# Patient Record
Sex: Female | Born: 1975 | Race: White | Hispanic: Yes | Smoking: Never smoker
Health system: Southern US, Community
[De-identification: ages and names within clinical notes are randomized; demographics above are authoritative.]

## PROBLEM LIST (undated history)

## (undated) DIAGNOSIS — K219 Gastro-esophageal reflux disease without esophagitis: Secondary | ICD-10-CM

## (undated) DIAGNOSIS — N814 Uterovaginal prolapse, unspecified: Secondary | ICD-10-CM

## (undated) DIAGNOSIS — Z973 Presence of spectacles and contact lenses: Secondary | ICD-10-CM

## (undated) DIAGNOSIS — R7611 Nonspecific reaction to tuberculin skin test without active tuberculosis: Secondary | ICD-10-CM

## (undated) DIAGNOSIS — A159 Respiratory tuberculosis unspecified: Secondary | ICD-10-CM

## (undated) HISTORY — DX: Respiratory tuberculosis unspecified: A15.9

## (undated) HISTORY — DX: Uterovaginal prolapse, unspecified: N81.4

---

## 1998-10-04 ENCOUNTER — Emergency Department (HOSPITAL_COMMUNITY): Admission: EM | Admit: 1998-10-04 | Discharge: 1998-10-04 | Payer: Self-pay | Admitting: Emergency Medicine

## 1998-12-04 ENCOUNTER — Other Ambulatory Visit: Admission: RE | Admit: 1998-12-04 | Discharge: 1998-12-04 | Payer: Self-pay | Admitting: Obstetrics

## 1999-01-25 ENCOUNTER — Inpatient Hospital Stay (HOSPITAL_COMMUNITY): Admission: AD | Admit: 1999-01-25 | Discharge: 1999-01-25 | Payer: Self-pay | Admitting: Obstetrics & Gynecology

## 1999-01-27 ENCOUNTER — Inpatient Hospital Stay (HOSPITAL_COMMUNITY): Admission: AD | Admit: 1999-01-27 | Discharge: 1999-01-27 | Payer: Self-pay | Admitting: *Deleted

## 1999-01-29 ENCOUNTER — Ambulatory Visit (HOSPITAL_COMMUNITY): Admission: AD | Admit: 1999-01-29 | Discharge: 1999-01-29 | Payer: Self-pay | Admitting: *Deleted

## 1999-01-29 ENCOUNTER — Encounter (INDEPENDENT_AMBULATORY_CARE_PROVIDER_SITE_OTHER): Payer: Self-pay | Admitting: Specialist

## 1999-11-08 ENCOUNTER — Emergency Department (HOSPITAL_COMMUNITY): Admission: EM | Admit: 1999-11-08 | Discharge: 1999-11-08 | Payer: Self-pay | Admitting: Emergency Medicine

## 1999-11-09 ENCOUNTER — Encounter: Payer: Self-pay | Admitting: Emergency Medicine

## 1999-11-10 ENCOUNTER — Emergency Department (HOSPITAL_COMMUNITY): Admission: EM | Admit: 1999-11-10 | Discharge: 1999-11-10 | Payer: Self-pay | Admitting: Emergency Medicine

## 2000-04-18 ENCOUNTER — Ambulatory Visit (HOSPITAL_COMMUNITY): Admission: RE | Admit: 2000-04-18 | Discharge: 2000-04-18 | Payer: Self-pay | Admitting: Obstetrics & Gynecology

## 2000-06-13 ENCOUNTER — Ambulatory Visit (HOSPITAL_COMMUNITY): Admission: RE | Admit: 2000-06-13 | Discharge: 2000-06-13 | Payer: Self-pay | Admitting: *Deleted

## 2000-10-12 ENCOUNTER — Inpatient Hospital Stay (HOSPITAL_COMMUNITY): Admission: AD | Admit: 2000-10-12 | Discharge: 2000-10-14 | Payer: Self-pay | Admitting: *Deleted

## 2000-10-12 ENCOUNTER — Encounter (INDEPENDENT_AMBULATORY_CARE_PROVIDER_SITE_OTHER): Payer: Self-pay

## 2003-09-02 ENCOUNTER — Emergency Department (HOSPITAL_COMMUNITY): Admission: EM | Admit: 2003-09-02 | Discharge: 2003-09-02 | Payer: Self-pay | Admitting: Family Medicine

## 2004-01-05 ENCOUNTER — Emergency Department (HOSPITAL_COMMUNITY): Admission: EM | Admit: 2004-01-05 | Discharge: 2004-01-05 | Payer: Self-pay | Admitting: Emergency Medicine

## 2009-03-24 ENCOUNTER — Inpatient Hospital Stay (HOSPITAL_COMMUNITY): Admission: AD | Admit: 2009-03-24 | Discharge: 2009-03-24 | Payer: Self-pay | Admitting: Obstetrics & Gynecology

## 2009-05-23 ENCOUNTER — Inpatient Hospital Stay (HOSPITAL_COMMUNITY): Admission: AD | Admit: 2009-05-23 | Discharge: 2009-05-23 | Payer: Self-pay | Admitting: Family Medicine

## 2009-06-29 ENCOUNTER — Ambulatory Visit (HOSPITAL_COMMUNITY): Admission: RE | Admit: 2009-06-29 | Discharge: 2009-06-29 | Payer: Self-pay | Admitting: Obstetrics & Gynecology

## 2009-10-04 ENCOUNTER — Ambulatory Visit: Payer: Self-pay | Admitting: Obstetrics and Gynecology

## 2009-10-04 ENCOUNTER — Inpatient Hospital Stay (HOSPITAL_COMMUNITY): Admission: AD | Admit: 2009-10-04 | Discharge: 2009-10-05 | Payer: Self-pay | Admitting: Family Medicine

## 2010-06-20 LAB — CBC
HCT: 32.8 % — ABNORMAL LOW (ref 36.0–46.0)
HCT: 36.3 % (ref 36.0–46.0)
Hemoglobin: 11.4 g/dL — ABNORMAL LOW (ref 12.0–15.0)
Hemoglobin: 12.4 g/dL (ref 12.0–15.0)
MCH: 32.3 pg (ref 26.0–34.0)
MCHC: 34.3 g/dL (ref 30.0–36.0)
MCV: 94.2 fL (ref 78.0–100.0)
Platelets: 271 10*3/uL (ref 150–400)
RBC: 3.47 MIL/uL — ABNORMAL LOW (ref 3.87–5.11)
RBC: 3.85 MIL/uL — ABNORMAL LOW (ref 3.87–5.11)
RDW: 13.9 % (ref 11.5–15.5)
WBC: 10.1 10*3/uL (ref 4.0–10.5)
WBC: 8.5 10*3/uL (ref 4.0–10.5)

## 2010-06-20 LAB — RPR: RPR Ser Ql: NONREACTIVE

## 2010-07-05 LAB — WET PREP, GENITAL
Trich, Wet Prep: NONE SEEN
Yeast Wet Prep HPF POC: NONE SEEN

## 2010-07-05 LAB — GC/CHLAMYDIA PROBE AMP, GENITAL
Chlamydia, DNA Probe: NEGATIVE
GC Probe Amp, Genital: NEGATIVE

## 2013-10-19 ENCOUNTER — Encounter (HOSPITAL_COMMUNITY): Payer: Self-pay | Admitting: Emergency Medicine

## 2013-10-19 ENCOUNTER — Emergency Department (HOSPITAL_COMMUNITY)
Admission: EM | Admit: 2013-10-19 | Discharge: 2013-10-20 | Disposition: A | Discharge status: Home / Self Care | Payer: Medicaid Other | Attending: Emergency Medicine | Admitting: Emergency Medicine

## 2013-10-19 ENCOUNTER — Emergency Department (HOSPITAL_COMMUNITY): Payer: Medicaid Other

## 2013-10-19 DIAGNOSIS — R509 Fever, unspecified: Secondary | ICD-10-CM | POA: Diagnosis not present

## 2013-10-19 DIAGNOSIS — R0602 Shortness of breath: Secondary | ICD-10-CM | POA: Diagnosis not present

## 2013-10-19 DIAGNOSIS — Z3202 Encounter for pregnancy test, result negative: Secondary | ICD-10-CM | POA: Diagnosis not present

## 2013-10-19 DIAGNOSIS — R1011 Right upper quadrant pain: Secondary | ICD-10-CM | POA: Diagnosis present

## 2013-10-19 DIAGNOSIS — R110 Nausea: Secondary | ICD-10-CM | POA: Insufficient documentation

## 2013-10-19 DIAGNOSIS — K8020 Calculus of gallbladder without cholecystitis without obstruction: Secondary | ICD-10-CM | POA: Diagnosis not present

## 2013-10-19 DIAGNOSIS — Z79899 Other long term (current) drug therapy: Secondary | ICD-10-CM | POA: Insufficient documentation

## 2013-10-19 LAB — URINE MICROSCOPIC-ADD ON

## 2013-10-19 LAB — URINALYSIS, ROUTINE W REFLEX MICROSCOPIC
Bilirubin Urine: NEGATIVE
GLUCOSE, UA: NEGATIVE mg/dL
KETONES UR: NEGATIVE mg/dL
Nitrite: NEGATIVE
PROTEIN: NEGATIVE mg/dL
Specific Gravity, Urine: 1.014 (ref 1.005–1.030)
Urobilinogen, UA: 0.2 mg/dL (ref 0.0–1.0)
pH: 6.5 (ref 5.0–8.0)

## 2013-10-19 LAB — CBC WITH DIFFERENTIAL/PLATELET
BASOS PCT: 1 % (ref 0–1)
Basophils Absolute: 0 10*3/uL (ref 0.0–0.1)
EOS ABS: 0.5 10*3/uL (ref 0.0–0.7)
Eosinophils Relative: 6 % — ABNORMAL HIGH (ref 0–5)
HCT: 36 % (ref 36.0–46.0)
HEMOGLOBIN: 11.7 g/dL — AB (ref 12.0–15.0)
Lymphocytes Relative: 27 % (ref 12–46)
Lymphs Abs: 2.1 10*3/uL (ref 0.7–4.0)
MCH: 29.3 pg (ref 26.0–34.0)
MCHC: 32.5 g/dL (ref 30.0–36.0)
MCV: 90 fL (ref 78.0–100.0)
MONOS PCT: 9 % (ref 3–12)
Monocytes Absolute: 0.7 10*3/uL (ref 0.1–1.0)
NEUTROS PCT: 57 % (ref 43–77)
Neutro Abs: 4.5 10*3/uL (ref 1.7–7.7)
Platelets: 300 10*3/uL (ref 150–400)
RBC: 4 MIL/uL (ref 3.87–5.11)
RDW: 12.6 % (ref 11.5–15.5)
WBC: 7.7 10*3/uL (ref 4.0–10.5)

## 2013-10-19 LAB — COMPREHENSIVE METABOLIC PANEL
ALK PHOS: 83 U/L (ref 39–117)
ALT: 39 U/L — AB (ref 0–35)
AST: 30 U/L (ref 0–37)
Albumin: 3.6 g/dL (ref 3.5–5.2)
Anion gap: 16 — ABNORMAL HIGH (ref 5–15)
BILIRUBIN TOTAL: 0.3 mg/dL (ref 0.3–1.2)
BUN: 7 mg/dL (ref 6–23)
CHLORIDE: 100 meq/L (ref 96–112)
CO2: 22 meq/L (ref 19–32)
Calcium: 8.4 mg/dL (ref 8.4–10.5)
Creatinine, Ser: 0.52 mg/dL (ref 0.50–1.10)
GLUCOSE: 84 mg/dL (ref 70–99)
POTASSIUM: 3.5 meq/L — AB (ref 3.7–5.3)
SODIUM: 138 meq/L (ref 137–147)
TOTAL PROTEIN: 7.9 g/dL (ref 6.0–8.3)

## 2013-10-19 LAB — POC URINE PREG, ED: Preg Test, Ur: NEGATIVE

## 2013-10-19 LAB — LIPASE, BLOOD: Lipase: 37 U/L (ref 11–59)

## 2013-10-19 MED ORDER — HYDROCODONE-ACETAMINOPHEN 5-325 MG PO TABS: 1.0000 | ORAL_TABLET | ORAL | Status: DC | PRN

## 2013-10-19 MED ORDER — MORPHINE SULFATE 4 MG/ML IJ SOLN
4.0000 mg | Freq: Once | INTRAMUSCULAR | Status: AC
  Administered 2013-10-19: 4 mg via INTRAVENOUS
  Filled 2013-10-19: qty 1

## 2013-10-19 MED ORDER — ONDANSETRON HCL 4 MG/2ML IJ SOLN
4.0000 mg | Freq: Once | INTRAMUSCULAR | Status: AC
  Administered 2013-10-19: 4 mg via INTRAVENOUS
  Filled 2013-10-19: qty 2

## 2013-10-19 NOTE — ED Provider Notes (Signed)
Charne Mcbrien S 8:00 PM the patient discussed and signed. Patient with several days of right upper quadrant pains. No significant findings on laboratory testing. Abdominal ultrasound pending to evaluate a biliary cause.  9:00 PM ultrasound demonstrates impacted cholelithiasis within the gallbladder neck. There is gallbladder thickening, distention and slight pericholecystic fluid.  9:15 PM patient reevaluated. She has very minimal abdominal pain on exam. No peritoneal signs. She has no significant of complaints of pain at this time. She was given morphine at 7:15.  9:20 PM discussed the patient's case and ultrasound findings with Dr. Rosendo Gros with general surgery. He recommends giving the patient a by mouth challenge and she does not have any significant pains, nausea or vomiting symptoms she may be discharged to followup with the clinic on Monday.  11:15PM patient reevaluated after drinking fluids and having a snack. She has had part of this [place], applesauce and water. She reported having very mild return of pain for a brief time which resolved on its own after eating. She is currently feeling well does wish to return home and will plan to call the surgeon office on Monday. Strict return precautions were given and emphasized. Patient understands that she should return if she has severe pains.  Martie Lee, PA-C 10/19/13 2328

## 2013-10-19 NOTE — ED Notes (Signed)
To ultrasound

## 2013-10-19 NOTE — ED Provider Notes (Signed)
CSN: [id number]     Arrival date & time 10/19/13  1651 History   First MD Initiated Contact with Patient 10/19/13 1830     Chief Complaint  Patient presents with  . Abdominal Pain     (Consider location/radiation/quality/duration/timing/severity/associated sxs/prior Treatment) HPI Pt is a 38yo female presenting to ED with c/o RUQ pain that started 6 days ago.  Pt is accompanied by family who helped with translation. Pt states pain was in her epigastric region on Monday, 7/13, associated with nausea and vomiting x2.  Pain was 5/10 at worst but improved with omeprazole.  Pain has resolved from epigastrium but now located in her RUQ.  Pain is occasionally severe enough to take her breath away.  Denies chest pain or SOB at this time. Pain is 3/10 at this time. Pain does not radiate. Pain comes and goes w/o known aggrevating factors, omeprazole does occasionally help with pain.  Reports nausea today w/o vomiting. Pt has had hot and cold chills but no recorded fevers. Denies urinary or vaginal symptoms. Denies hx of abdominal surgeries. No hx of similar pain.    History reviewed. No pertinent past medical history. History reviewed. No pertinent past surgical history. History reviewed. No pertinent family history. History  Substance Use Topics  . Smoking status: Not on file  . Smokeless tobacco: Not on file  . Alcohol Use: No   OB History   Grav Para Term Preterm Abortions TAB SAB Ect Mult Living                 Review of Systems  Constitutional: Positive for fever ( subjective) and chills. Negative for diaphoresis, appetite change and fatigue.  Respiratory: Positive for shortness of breath ( occasionally when pain is severe). Negative for cough.   Cardiovascular: Negative for chest pain, palpitations and leg swelling.  Gastrointestinal: Positive for nausea and abdominal pain (RUQ). Negative for vomiting, diarrhea and constipation.  Genitourinary: Negative for dysuria, urgency, frequency,  hematuria, flank pain, vaginal discharge, vaginal pain and pelvic pain.  Musculoskeletal: Negative for back pain and myalgias.  All other systems reviewed and are negative.     Allergies  Review of patient's allergies indicates no known allergies.  Home Medications   Prior to Admission medications   Medication Sig Start Date End Date Taking? Authorizing Provider  alum & mag hydroxide-simeth (MAALOX/MYLANTA) 200-200-20 MG/5ML suspension Take 15 mLs by mouth every 6 (six) hours as needed for indigestion or heartburn.   Yes Historical Provider, MD  omeprazole (PRILOSEC) 20 MG capsule Take 20 mg by mouth daily.   Yes Historical Provider, MD   BP 130/79  Pulse 89  Temp(Src) 97.6 F (36.4 C) (Oral)  Resp 16  SpO2 100%  LMP 09/22/2013 Physical Exam  Nursing note and vitals reviewed. Constitutional: She appears well-developed and well-nourished. No distress.  Pt lying in exam bed, NAD.  HENT:  Head: Normocephalic and atraumatic.  Eyes: Conjunctivae are normal. No scleral icterus.  Neck: Normal range of motion.  Cardiovascular: Normal rate, regular rhythm and normal heart sounds.   Pulmonary/Chest: Effort normal and breath sounds normal. No respiratory distress. She has no wheezes. She has no rales. She exhibits no tenderness.  Abdominal: Soft. Bowel sounds are normal. She exhibits no distension and no mass. There is tenderness (RUQ). There is no rebound and no guarding.  Soft, non-distended, tenderness in RUQ.  No rebound, guarding or masses. No CVAT.  Musculoskeletal: Normal range of motion.  Neurological: She is alert.  Skin: Skin is  warm and dry. She is not diaphoretic.    ED Course  Procedures (including critical care time) Labs Review Labs Reviewed  COMPREHENSIVE METABOLIC PANEL - Abnormal; Notable for the following:    Potassium 3.5 (*)    ALT 39 (*)    Anion gap 16 (*)    All other components within normal limits  CBC WITH DIFFERENTIAL - Abnormal; Notable for the  following:    Hemoglobin 11.7 (*)    Eosinophils Relative 6 (*)    All other components within normal limits  URINALYSIS, ROUTINE W REFLEX MICROSCOPIC - Abnormal; Notable for the following:    Hgb urine dipstick TRACE (*)    Leukocytes, UA TRACE (*)    All other components within normal limits  LIPASE, BLOOD  URINE MICROSCOPIC-ADD ON  POC URINE PREG, ED    Imaging Review No results found.   EKG Interpretation None      MDM   Final diagnoses:  None    Pt is a 38yo female presenting to ED c/o mid-abdominal pain that moved to RUQ, associated with nausea and subjective fever.  Pt appears well, non-toxic, afebrile, however, pt is tender in RUQ. Labs: unremarkable. Concern for cholelithiasis vs cholecystitis.  Not concerned for SBO.  Pain managed with IV morphine.  U/S abd pending.   8:30 PM Pt signed out to [employer] PA-C at shift change. Plan is to f/u on U/S and reassess pt's pain.  Pt likely to be discharged home to f/u with PCP and possibly [facility] if U/S shows evidence of gallstones.    Noland Fordyce, PA-C 10/20/13 1111

## 2013-10-19 NOTE — ED Notes (Signed)
Pt last ate food at noon today pt drinking iced water 240 ml currently

## 2013-10-19 NOTE — ED Notes (Signed)
Reports abd cramping and pain that started one week ago, has moved from mid abd to right side. Having n/v and denies urinary symptoms or diarrhea.

## 2013-10-19 NOTE — ED Notes (Signed)
Returned from u/s

## 2013-10-19 NOTE — Discharge Instructions (Signed)
Please followup with the General surgery office on Monday. Return for any changing or worsening symptoms.    Colelitiasis (Cholelithiasis) La colelitiasis (tambin llamada clculos en la vescula) es una enfermedad en la que se forman piedras en la vescula. La vescula es un rgano que almacena la bilis que se forma en el hgado y que ayuda a Licensed conveyancer. Los clculos comienzan como pequeos cristales y lentamente se transforman en piedras. El dolor en la vescula ocurre cuando se producen espasmos y los clculos obstruyen el conducto. El dolor tambin se produce cuando una piedra sale por el conducto.  FACTORES DE RIESGO  Ser mujer.   Tener embarazos mltiples. Algunas veces los mdicos aconsejan extirpar los clculos biliares antes de futuros embarazos.   Ser obeso.  Dietas que incluyan comidas fritas y grasas.   Ser mayor de 51 aos y el aumento de la edad.   El uso prolongado de medicamentos que contengan hormonas femeninas.   Tener diabetes mellitus.   Prdida rpida de peso.   Historia familiar de clculos (herencia).  SNTOMAS  Nuseas.   Vmitos.  Dolor abdominal.   Piel amarilla (ictericia)   Dolor sbito. Puede persistir desde algunos minutos hasta algunas horas.  Cristy Hilts.   Sensibilidad al tacto. En algunos casos, cuando los clculos biliares no se mueven hacia el conducto biliar, las personas no sienten dolor ni presentan sntomas. Estos se denominan clculos "silenciosos".  TRATAMIENTO Los clculos silenciosos no requieren Clinical research associate. En los [employer], podr ser [place]. Las opciones de tratamiento son:  Dwaine Gale para extirpar la vescula. Es el tratamiento ms frecuente.  Medicamentos. No siempre dan resultado y pueden demorar entre 6 y 61 meses o ms en Chief of Staff.  Tratamiento con ondas de choque (litotricia biliar extracorporal). En este tratamiento, una mquina de ultrasonido enva ondas de choque a la  vescula para destruir los clculos en pequeos fragmentos que luego podrn pasar a los intestinos o ser disueltas con medicamentos. INSTRUCCIONES PARA EL CUIDADO EN EL HOGAR   Slo tome medicamentos de venta libre o recetados para Glass blower/designer, Health and safety inspector o bajar la fiebre, segn las indicaciones de su mdico.   Siga una dieta baja en grasas hasta que su mdico lo vea nuevamente. Las grasas hacen que la vescula se Location manager, lo que puede Orthoptist.   Concurra a las consultas de control con su mdico segn las indicaciones. Los ataques casi siempre son recurrentes y generalmente habr que someterse a una ciruga como [place].  SOLICITE ATENCIN MDICA DE INMEDIATO SI:   El dolor aumenta y no puede controlarlo con los medicamentos.   Tiene fiebre o sntomas persistentes durante ms de 2 - 3 das.   Tiene fiebre y los sntomas empeoran repentinamente.   Tiene nuseas o vmitos persistentes.  ASEGRESE DE QUE:   Comprende estas instrucciones.  Controlar su afeccin.  Recibir ayuda de inmediato si no mejora o si empeora. Document Released: 01/05/2006 Document Revised: 11/21/2012 [facility] Patient Information 2015 [place]. This information is not intended to replace advice given to you by your health care provider. Make sure you discuss any questions you have with your health care provider.

## 2013-10-19 NOTE — ED Notes (Signed)
Pt denies any pain after drinking water

## 2013-10-21 ENCOUNTER — Observation Stay (HOSPITAL_COMMUNITY): Payer: Medicaid Other

## 2013-10-21 ENCOUNTER — Observation Stay (HOSPITAL_COMMUNITY): Payer: Medicaid Other | Admitting: Anesthesiology

## 2013-10-21 ENCOUNTER — Encounter (HOSPITAL_COMMUNITY): Payer: Medicaid Other | Admitting: Anesthesiology

## 2013-10-21 ENCOUNTER — Encounter (HOSPITAL_COMMUNITY)
Admission: EM | Disposition: A | Discharge status: Home / Self Care | Payer: Self-pay | Source: Home / Self Care | Attending: Emergency Medicine

## 2013-10-21 ENCOUNTER — Observation Stay (HOSPITAL_COMMUNITY)
Admission: EM | Admit: 2013-10-21 | Discharge: 2013-10-22 | Disposition: A | Discharge status: Home / Self Care | Payer: Medicaid Other | Attending: General Surgery | Admitting: General Surgery

## 2013-10-21 ENCOUNTER — Encounter (HOSPITAL_COMMUNITY): Payer: Self-pay | Admitting: Emergency Medicine

## 2013-10-21 DIAGNOSIS — K8000 Calculus of gallbladder with acute cholecystitis without obstruction: Principal | ICD-10-CM | POA: Diagnosis present

## 2013-10-21 DIAGNOSIS — R1011 Right upper quadrant pain: Secondary | ICD-10-CM | POA: Diagnosis present

## 2013-10-21 DIAGNOSIS — K8010 Calculus of gallbladder with chronic cholecystitis without obstruction: Secondary | ICD-10-CM

## 2013-10-21 HISTORY — PX: CHOLECYSTECTOMY: SHX55

## 2013-10-21 HISTORY — DX: Gastro-esophageal reflux disease without esophagitis: K21.9

## 2013-10-21 HISTORY — PX: LAPAROSCOPIC CHOLECYSTECTOMY: SUR755

## 2013-10-21 LAB — CBC WITH DIFFERENTIAL/PLATELET
Basophils Absolute: 0 10*3/uL (ref 0.0–0.1)
Basophils Relative: 0 % (ref 0–1)
Eosinophils Absolute: 0.2 10*3/uL (ref 0.0–0.7)
Eosinophils Relative: 3 % (ref 0–5)
HEMATOCRIT: 35.1 % — AB (ref 36.0–46.0)
HEMOGLOBIN: 11.5 g/dL — AB (ref 12.0–15.0)
LYMPHS PCT: 22 % (ref 12–46)
Lymphs Abs: 1.2 10*3/uL (ref 0.7–4.0)
MCH: 29.4 pg (ref 26.0–34.0)
MCHC: 32.8 g/dL (ref 30.0–36.0)
MCV: 89.8 fL (ref 78.0–100.0)
MONO ABS: 0.5 10*3/uL (ref 0.1–1.0)
MONOS PCT: 10 % (ref 3–12)
NEUTROS ABS: 3.4 10*3/uL (ref 1.7–7.7)
Neutrophils Relative %: 65 % (ref 43–77)
Platelets: 306 10*3/uL (ref 150–400)
RBC: 3.91 MIL/uL (ref 3.87–5.11)
RDW: 12.6 % (ref 11.5–15.5)
WBC: 5.2 10*3/uL (ref 4.0–10.5)

## 2013-10-21 LAB — COMPREHENSIVE METABOLIC PANEL
ALT: 543 U/L — ABNORMAL HIGH (ref 0–35)
ANION GAP: 15 (ref 5–15)
AST: 694 U/L — AB (ref 0–37)
Albumin: 3.7 g/dL (ref 3.5–5.2)
Alkaline Phosphatase: 254 U/L — ABNORMAL HIGH (ref 39–117)
BILIRUBIN TOTAL: 0.8 mg/dL (ref 0.3–1.2)
BUN: 9 mg/dL (ref 6–23)
CHLORIDE: 100 meq/L (ref 96–112)
CO2: 25 meq/L (ref 19–32)
CREATININE: 0.5 mg/dL (ref 0.50–1.10)
Calcium: 8.8 mg/dL (ref 8.4–10.5)
GFR calc Af Amer: >90 mL/min (ref >90)
Glucose, Bld: 97 mg/dL (ref 70–99)
Potassium: 3.5 mEq/L — ABNORMAL LOW (ref 3.7–5.3)
Sodium: 140 mEq/L (ref 137–147)
Total Protein: 8 g/dL (ref 6.0–8.3)

## 2013-10-21 LAB — LIPASE, BLOOD: LIPASE: 33 U/L (ref 11–59)

## 2013-10-21 SURGERY — LAPAROSCOPIC CHOLECYSTECTOMY WITH INTRAOPERATIVE CHOLANGIOGRAM
Anesthesia: General | Site: Abdomen

## 2013-10-21 MED ORDER — OXYCODONE-ACETAMINOPHEN 5-325 MG PO TABS
1.0000 | ORAL_TABLET | ORAL | Status: DC | PRN
  Administered 2013-10-22 (×3): 2 via ORAL
  Filled 2013-10-21 (×3): qty 2

## 2013-10-21 MED ORDER — PROPOFOL 10 MG/ML IV BOLUS
INTRAVENOUS | Status: DC | PRN
  Administered 2013-10-21: 120 mg via INTRAVENOUS

## 2013-10-21 MED ORDER — SODIUM CHLORIDE 0.9 % IV SOLN
INTRAVENOUS | Status: DC
  Administered 2013-10-21: 125 mL/h via INTRAVENOUS

## 2013-10-21 MED ORDER — MIDAZOLAM HCL 5 MG/5ML IJ SOLN
INTRAMUSCULAR | Status: DC | PRN
  Administered 2013-10-21: 2 mg via INTRAVENOUS

## 2013-10-21 MED ORDER — DIPHENHYDRAMINE HCL 12.5 MG/5ML PO ELIX
12.5000 mg | ORAL_SOLUTION | Freq: Four times a day (QID) | ORAL | Status: DC | PRN
Start: 2013-10-21 — End: 2013-10-21

## 2013-10-21 MED ORDER — ONDANSETRON HCL 4 MG/2ML IJ SOLN
4.0000 mg | Freq: Once | INTRAMUSCULAR | Status: AC | PRN
  Administered 2013-10-21: 4 mg via INTRAVENOUS

## 2013-10-21 MED ORDER — ONDANSETRON HCL 4 MG/2ML IJ SOLN
INTRAMUSCULAR | Status: DC | PRN
  Administered 2013-10-21: 4 mg via INTRAVENOUS

## 2013-10-21 MED ORDER — DEXTROSE 5 % IV SOLN
2.0000 g | INTRAVENOUS | Status: DC
  Administered 2013-10-21: 2 g via INTRAVENOUS
  Filled 2013-10-21 (×2): qty 2

## 2013-10-21 MED ORDER — OXYCODONE HCL 5 MG PO TABS
5.0000 mg | ORAL_TABLET | Freq: Once | ORAL | Status: AC | PRN
  Administered 2013-10-21: 5 mg via ORAL

## 2013-10-21 MED ORDER — ONDANSETRON HCL 4 MG PO TABS: 4.0000 mg | ORAL_TABLET | Freq: Four times a day (QID) | ORAL | Status: DC | PRN

## 2013-10-21 MED ORDER — ONDANSETRON HCL 4 MG/2ML IJ SOLN
INTRAMUSCULAR | Status: AC
  Filled 2013-10-21: qty 2

## 2013-10-21 MED ORDER — PROPOFOL 10 MG/ML IV BOLUS
INTRAVENOUS | Status: AC
  Filled 2013-10-21: qty 20

## 2013-10-21 MED ORDER — 0.9 % SODIUM CHLORIDE (POUR BTL) OPTIME
TOPICAL | Status: DC | PRN
  Administered 2013-10-21: 1000 mL

## 2013-10-21 MED ORDER — MORPHINE SULFATE 2 MG/ML IJ SOLN: 1.0000 mg | INTRAMUSCULAR | Status: DC | PRN

## 2013-10-21 MED ORDER — OXYCODONE HCL 5 MG PO TABS
ORAL_TABLET | ORAL | Status: AC
  Filled 2013-10-21: qty 1

## 2013-10-21 MED ORDER — BUPIVACAINE HCL 0.25 % IJ SOLN
INTRAMUSCULAR | Status: DC | PRN
  Administered 2013-10-21: 6 mL

## 2013-10-21 MED ORDER — FENTANYL CITRATE 0.05 MG/ML IJ SOLN
INTRAMUSCULAR | Status: AC
  Filled 2013-10-21: qty 5

## 2013-10-21 MED ORDER — SUCCINYLCHOLINE CHLORIDE 20 MG/ML IJ SOLN
INTRAMUSCULAR | Status: DC | PRN
  Administered 2013-10-21: 100 mg via INTRAVENOUS

## 2013-10-21 MED ORDER — POTASSIUM CHLORIDE IN NACL 20-0.9 MEQ/L-% IV SOLN
INTRAVENOUS | Status: DC
  Administered 2013-10-21 – 2013-10-22 (×2): via INTRAVENOUS
  Filled 2013-10-21 (×4): qty 1000

## 2013-10-21 MED ORDER — SODIUM CHLORIDE 0.9 % IR SOLN
Status: DC | PRN
  Administered 2013-10-21: 1000 mL

## 2013-10-21 MED ORDER — LACTATED RINGERS IV SOLN
INTRAVENOUS | Status: DC | PRN
  Administered 2013-10-21 (×2): via INTRAVENOUS

## 2013-10-21 MED ORDER — MEPERIDINE HCL 25 MG/ML IJ SOLN: 6.2500 mg | INTRAMUSCULAR | Status: DC | PRN

## 2013-10-21 MED ORDER — ROCURONIUM BROMIDE 100 MG/10ML IV SOLN
INTRAVENOUS | Status: DC | PRN
  Administered 2013-10-21: 15 mg via INTRAVENOUS
  Administered 2013-10-21: 25 mg via INTRAVENOUS

## 2013-10-21 MED ORDER — OXYCODONE HCL 5 MG/5ML PO SOLN: 5.0000 mg | Freq: Once | ORAL | Status: AC | PRN

## 2013-10-21 MED ORDER — FENTANYL CITRATE 0.05 MG/ML IJ SOLN
INTRAMUSCULAR | Status: DC | PRN
  Administered 2013-10-21: 100 ug via INTRAVENOUS
  Administered 2013-10-21 (×3): 50 ug via INTRAVENOUS

## 2013-10-21 MED ORDER — ONDANSETRON HCL 4 MG/2ML IJ SOLN
4.0000 mg | Freq: Four times a day (QID) | INTRAMUSCULAR | Status: DC | PRN
  Administered 2013-10-21: 4 mg via INTRAVENOUS
  Filled 2013-10-21: qty 2

## 2013-10-21 MED ORDER — MORPHINE SULFATE 4 MG/ML IJ SOLN
4.0000 mg | INTRAMUSCULAR | Status: DC | PRN
  Administered 2013-10-21: 4 mg via INTRAVENOUS
  Filled 2013-10-21: qty 1

## 2013-10-21 MED ORDER — HYDROMORPHONE HCL PF 1 MG/ML IJ SOLN
INTRAMUSCULAR | Status: AC
  Filled 2013-10-21: qty 1

## 2013-10-21 MED ORDER — ONDANSETRON HCL 4 MG/2ML IJ SOLN
4.0000 mg | INTRAMUSCULAR | Status: DC | PRN
  Administered 2013-10-21: 4 mg via INTRAVENOUS
  Filled 2013-10-21: qty 2

## 2013-10-21 MED ORDER — HYDROMORPHONE HCL PF 1 MG/ML IJ SOLN
0.2500 mg | INTRAMUSCULAR | Status: DC | PRN
  Administered 2013-10-21 (×3): 0.5 mg via INTRAVENOUS

## 2013-10-21 MED ORDER — HYDROMORPHONE HCL PF 1 MG/ML IJ SOLN
1.0000 mg | INTRAMUSCULAR | Status: DC | PRN
Start: 2013-10-21 — End: 2013-10-22
  Administered 2013-10-21: 1 mg via INTRAVENOUS
  Filled 2013-10-21: qty 1

## 2013-10-21 MED ORDER — NEOSTIGMINE METHYLSULFATE 10 MG/10ML IV SOLN
INTRAVENOUS | Status: DC | PRN
  Administered 2013-10-21: 3 mg via INTRAVENOUS

## 2013-10-21 MED ORDER — DIPHENHYDRAMINE HCL 50 MG/ML IJ SOLN: 12.5000 mg | Freq: Four times a day (QID) | INTRAMUSCULAR | Status: DC | PRN

## 2013-10-21 MED ORDER — ONDANSETRON HCL 4 MG/2ML IJ SOLN: 4.0000 mg | Freq: Four times a day (QID) | INTRAMUSCULAR | Status: DC | PRN

## 2013-10-21 MED ORDER — MIDAZOLAM HCL 2 MG/2ML IJ SOLN
INTRAMUSCULAR | Status: AC
  Filled 2013-10-21: qty 2

## 2013-10-21 MED ORDER — IOHEXOL 300 MG/ML  SOLN
INTRAMUSCULAR | Status: DC | PRN
  Administered 2013-10-21: 10 mL

## 2013-10-21 MED ORDER — BUPIVACAINE HCL (PF) 0.25 % IJ SOLN
INTRAMUSCULAR | Status: AC
  Filled 2013-10-21: qty 30

## 2013-10-21 MED ORDER — PIPERACILLIN-TAZOBACTAM 4.5 G IVPB
4.5000 g | Freq: Once | INTRAVENOUS | Status: DC
  Filled 2013-10-21: qty 100

## 2013-10-21 MED ORDER — GLYCOPYRROLATE 0.2 MG/ML IJ SOLN
INTRAMUSCULAR | Status: DC | PRN
  Administered 2013-10-21: 0.4 mg via INTRAVENOUS

## 2013-10-21 MED ORDER — LIDOCAINE HCL (CARDIAC) 20 MG/ML IV SOLN
INTRAVENOUS | Status: DC | PRN
  Administered 2013-10-21: 30 mg via INTRAVENOUS

## 2013-10-21 SURGICAL SUPPLY — 51 items
APL SKNCLS STERI-STRIP NONHPOA (GAUZE/BANDAGES/DRESSINGS) ×1
APPLIER CLIP 5 13 M/L LIGAMAX5 (MISCELLANEOUS)
APR CLP MED LRG 5 ANG JAW (MISCELLANEOUS)
BAG SPEC RTRVL LRG 6X4 10 (ENDOMECHANICALS)
BENZOIN TINCTURE PRP APPL 2/3 (GAUZE/BANDAGES/DRESSINGS) ×3 IMPLANT
CANISTER SUCTION 2500CC (MISCELLANEOUS) ×3 IMPLANT
CHLORAPREP W/TINT 26ML (MISCELLANEOUS) ×3 IMPLANT
CLIP APPLIE 5 13 M/L LIGAMAX5 (MISCELLANEOUS) IMPLANT
CLIP LIGATING HEMO O LOK GREEN (MISCELLANEOUS) ×5 IMPLANT
COVER MAYO STAND STRL (DRAPES) ×3 IMPLANT
COVER SURGICAL LIGHT HANDLE (MISCELLANEOUS) ×3 IMPLANT
COVER TRANSDUCER ULTRASND (DRAPES) ×2 IMPLANT
DEVICE TROCAR PUNCTURE CLOSURE (ENDOMECHANICALS) ×3 IMPLANT
DRAPE C-ARM 42X72 X-RAY (DRAPES) ×3 IMPLANT
DRAPE UTILITY 15X26 W/TAPE STR (DRAPE) ×6 IMPLANT
ELECT REM PT RETURN 9FT ADLT (ELECTROSURGICAL) ×3
ELECTRODE REM PT RTRN 9FT ADLT (ELECTROSURGICAL) ×1 IMPLANT
GAUZE SPONGE 2X2 8PLY STRL LF (GAUZE/BANDAGES/DRESSINGS) ×1 IMPLANT
GLOVE BIO SURGEON STRL SZ7.5 (GLOVE) ×3 IMPLANT
GLOVE BIOGEL PI IND STRL 6.5 (GLOVE) IMPLANT
GLOVE BIOGEL PI IND STRL 7.0 (GLOVE) IMPLANT
GLOVE BIOGEL PI INDICATOR 6.5 (GLOVE) ×2
GLOVE BIOGEL PI INDICATOR 7.0 (GLOVE) ×4
GLOVE SURG SS PI 6.5 STRL IVOR (GLOVE) ×2 IMPLANT
GLOVE SURG SS PI 7.0 STRL IVOR (GLOVE) ×4 IMPLANT
GOWN STRL REUS W/ TWL LRG LVL3 (GOWN DISPOSABLE) ×3 IMPLANT
GOWN STRL REUS W/ TWL XL LVL3 (GOWN DISPOSABLE) ×1 IMPLANT
GOWN STRL REUS W/TWL LRG LVL3 (GOWN DISPOSABLE) ×9
GOWN STRL REUS W/TWL XL LVL3 (GOWN DISPOSABLE) ×3
IV CATH 14GX2 1/4 (CATHETERS) ×3 IMPLANT
KIT BASIN OR (CUSTOM PROCEDURE TRAY) ×3 IMPLANT
KIT ROOM TURNOVER OR (KITS) ×3 IMPLANT
NDL INSUFFLATION 14GA 120MM (NEEDLE) ×1 IMPLANT
NEEDLE INSUFFLATION 14GA 120MM (NEEDLE) ×3 IMPLANT
NS IRRIG 1000ML POUR BTL (IV SOLUTION) ×3 IMPLANT
PAD ARMBOARD 7.5X6 YLW CONV (MISCELLANEOUS) ×6 IMPLANT
POUCH SPECIMEN RETRIEVAL 10MM (ENDOMECHANICALS) IMPLANT
SCISSORS LAP 5X35 DISP (ENDOMECHANICALS) ×3 IMPLANT
SET CHOLANGIOGRAPHY FRANKLIN (SET/KITS/TRAYS/PACK) ×3 IMPLANT
SET IRRIG TUBING LAPAROSCOPIC (IRRIGATION / IRRIGATOR) ×3 IMPLANT
SLEEVE ENDOPATH XCEL 5M (ENDOMECHANICALS) ×3 IMPLANT
SPECIMEN JAR MEDIUM (MISCELLANEOUS) ×2 IMPLANT
SPECIMEN JAR SMALL (MISCELLANEOUS) ×1 IMPLANT
SPONGE GAUZE 2X2 STER 10/PKG (GAUZE/BANDAGES/DRESSINGS) ×2
SUT MNCRL AB 3-0 PS2 18 (SUTURE) ×3 IMPLANT
SUT VICRYL 0 UR6 27IN ABS (SUTURE) ×2 IMPLANT
TOWEL OR 17X24 6PK STRL BLUE (TOWEL DISPOSABLE) ×1 IMPLANT
TOWEL OR 17X26 10 PK STRL BLUE (TOWEL DISPOSABLE) ×3 IMPLANT
TRAY LAPAROSCOPIC (CUSTOM PROCEDURE TRAY) ×3 IMPLANT
TROCAR XCEL NON-BLD 11X100MML (ENDOMECHANICALS) ×3 IMPLANT
TROCAR XCEL NON-BLD 5MMX100MML (ENDOMECHANICALS) ×3 IMPLANT

## 2013-10-21 NOTE — ED Provider Notes (Signed)
CSN: [id number]     Arrival date & time 10/21/13  1104 History   First MD Initiated Contact with Patient 10/21/13 1120     Chief Complaint  Patient presents with  . Abdominal Pain     HPI Pt was seen at 1130.  Per pt, c/o gradual onset and worsening of persistent RUQ abd "pain" for the past 1 week, worse over the past 2 days.  Has been associated with multiple intermittent episodes of N/V.  Describes the abd pain as "cramping."  Pt states she was evaluated in the ED 2 days ago for same, dx "gallbladder problems" and instructed to f/u with General Surgeon. Family states the General Surgeon referred her back to the ED "because she wasn't getting better." Denies diarrhea, no fevers, no back pain, no rash, no CP/SOB, no black or blood in stools or emesis.      History reviewed. No pertinent past medical history.  History reviewed. No pertinent past surgical history.  History  Substance Use Topics  . Smoking status: Never Smoker   . Smokeless tobacco: Not on file  . Alcohol Use: No    Review of Systems ROS: Statement: All systems negative except as marked or noted in the HPI; Constitutional: Negative for fever and chills. ; ; Eyes: Negative for eye pain, redness and discharge. ; ; ENMT: Negative for ear pain, hoarseness, nasal congestion, sinus pressure and sore throat. ; ; Cardiovascular: Negative for chest pain, palpitations, diaphoresis, dyspnea and peripheral edema. ; ; Respiratory: Negative for cough, wheezing and stridor. ; ; Gastrointestinal: +abd pain, N/V. Negative for diarrhea, blood in stool, hematemesis, jaundice and rectal bleeding. . ; ; Genitourinary: Negative for dysuria, flank pain and hematuria. ; ; Musculoskeletal: Negative for back pain and neck pain. Negative for swelling and trauma.; ; Skin: Negative for pruritus, rash, abrasions, blisters, bruising and skin lesion.; ; Neuro: Negative for headache, lightheadedness and neck stiffness. Negative for weakness, altered level of  consciousness , altered mental status, extremity weakness, paresthesias, involuntary movement, seizure and syncope.      Allergies  Review of patient's allergies indicates no known allergies.  Home Medications   Prior to Admission medications   Medication Sig Start Date End Date Taking? Authorizing Provider  HYDROcodone-acetaminophen (NORCO/VICODIN) 5-325 MG per tablet Take 1 tablet by mouth every 4 (four) hours as needed for moderate pain. 10/19/13  Yes Peter S Dammen, PA-C  omeprazole (PRILOSEC) 20 MG capsule Take 20 mg by mouth 2 (two) times daily.    Yes Historical Provider, MD   BP 134/72  Pulse 69  Temp(Src) 98.6 F (37 C) (Oral)  Resp 18  Wt 148 lb (67.132 kg)  SpO2 99%  LMP 09/22/2013 Physical Exam 1135: Physical examination:  Nursing notes reviewed; Vital signs and O2 SAT reviewed;  Constitutional: Well developed, Well nourished, Well hydrated, In no acute distress; Head:  Normocephalic, atraumatic; Eyes: EOMI, PERRL, No scleral icterus; ENMT: Mouth and pharynx normal, Mucous membranes moist; Neck: Supple, Full range of motion, No lymphadenopathy; Cardiovascular: Regular rate and rhythm, No murmur, rub, or gallop; Respiratory: Breath sounds clear & equal bilaterally, No rales, rhonchi, wheezes.  Speaking full sentences with ease, Normal respiratory effort/excursion; Chest: Nontender, Movement normal; Abdomen: Soft, +RUQ tenderness to palp. Nondistended, Normal bowel sounds; Genitourinary: No CVA tenderness; Extremities: Pulses normal, No tenderness, No edema, No calf edema or asymmetry.; Neuro: AA&Ox3, Major CN grossly intact.  Speech clear. No gross focal motor or sensory deficits in extremities.; Skin: Color normal, Warm, Dry.  ED Course  Procedures    1200:  [place] from 10/19/13 with +acute cholecystitis. Will re-check labs today, start IV abx.  T/C to General Surgery PA, case discussed, including:  HPI, pertinent PM/SHx, VS/PE, dx testing, ED course and treatment:  Agreeable to  come to ED for evaluation.    MDM  MDM Reviewed: previous chart, nursing note and vitals Reviewed previous: labs and ultrasound Interpretation: labs    US Abdomen Complete 10/19/2013   CLINICAL DATA:  Right upper quadrant pain  EXAM: ULTRASOUND ABDOMEN COMPLETE  COMPARISON:  None.  FINDINGS: Gallbladder:  Cholelithiasis impacted within the gallbladder neck. Gallbladder wall thickening measuring up to 7.5 mm. Trace amount of pericholecystic fluid. Distended gallbladder. Negative sonographic Murphy sign, but the patient has been medicated.  Common bile duct:  Diameter: 6.3 mm without choledocholithiasis.  Liver:  No focal lesion identified. Within normal limits in parenchymal echogenicity.  IVC:  No abnormality visualized.  Pancreas:  Visualized portion unremarkable.  Spleen:  Size and appearance within normal limits.  Right Kidney:  Length: 10.8 cm. Echogenicity within normal limits. No mass or hydronephrosis visualized.  Left Kidney:  Length: 11.5 cm. Echogenicity within normal limits. No mass or hydronephrosis visualized.  Abdominal aorta:  No aneurysm visualized.  Other findings:  None.  IMPRESSION: 1. Impacted gallstone in the gallbladder neck with gallbladder distention, pericholecystic fluid and gallbladder wall thickening. Overall findings are most concerning for acute cholecystitis.   Electronically Signed   By: Kathreen Devoid   On: 10/19/2013 20:56    Results for orders placed during the hospital encounter of 10/21/13  CBC WITH DIFFERENTIAL      Result Value Ref Range   WBC 5.2  4.0 - 10.5 K/uL   RBC 3.91  3.87 - 5.11 MIL/uL   Hemoglobin 11.5 (*) 12.0 - 15.0 g/dL   HCT 35.1 (*) 36.0 - 46.0 %   MCV 89.8  78.0 - 100.0 fL   MCH 29.4  26.0 - 34.0 pg   MCHC 32.8  30.0 - 36.0 g/dL   RDW 12.6  11.5 - 15.5 %   Platelets 306  150 - 400 K/uL   Neutrophils Relative % 65  43 - 77 %   Neutro Abs 3.4  1.7 - 7.7 K/uL   Lymphocytes Relative 22  12 - 46 %   Lymphs Abs 1.2  0.7 - 4.0 K/uL    Monocytes Relative 10  3 - 12 %   Monocytes Absolute 0.5  0.1 - 1.0 K/uL   Eosinophils Relative 3  0 - 5 %   Eosinophils Absolute 0.2  0.0 - 0.7 K/uL   Basophils Relative 0  0 - 1 %   Basophils Absolute 0.0  0.0 - 0.1 K/uL  COMPREHENSIVE METABOLIC PANEL      Result Value Ref Range   Sodium 140  137 - 147 mEq/L   Potassium 3.5 (*) 3.7 - 5.3 mEq/L   Chloride 100  96 - 112 mEq/L   CO2 25  19 - 32 mEq/L   Glucose, Bld 97  70 - 99 mg/dL   BUN 9  6 - 23 mg/dL   Creatinine, Ser 0.50  0.50 - 1.10 mg/dL   Calcium 8.8  8.4 - 10.5 mg/dL   Total Protein 8.0  6.0 - 8.3 g/dL   Albumin 3.7  3.5 - 5.2 g/dL   AST 694 (*) 0 - 37 U/L   ALT 543 (*) 0 - 35 U/L   Alkaline Phosphatase 254 (*) 39 -  117 U/L   Total Bilirubin 0.8  0.3 - 1.2 mg/dL   GFR calc non Af Amer >90  >90 mL/min   GFR calc Af Amer >90  >90 mL/min   Anion gap 15  5 - 15  LIPASE, BLOOD      Result Value Ref Range   Lipase 33  11 - 59 U/L          Alfonzo Feller, DO 10/24/13 1616

## 2013-10-21 NOTE — H&P (Signed)
I have seen and examined the pt and agree with PA-Jenning's  Note. Acute cholecystitis To OR for lap chole with IOC

## 2013-10-21 NOTE — Transfer of Care (Signed)
Immediate Anesthesia Transfer of Care Note  Patient: Danielle Campbell  Procedure(s) Performed: Procedure(s): LAPAROSCOPIC CHOLECYSTECTOMY WITH INTRAOPERATIVE CHOLANGIOGRAM (N/A)  Patient Location: PACU  Anesthesia Type:General  Level of Consciousness: awake, alert , oriented and patient cooperative  Airway & Oxygen Therapy: Patient Spontanous Breathing  Post-op Assessment: Report given to PACU RN, Post -op Vital signs reviewed and stable and Patient moving all extremities  Post vital signs: Reviewed and stable  Complications: No apparent anesthesia complications

## 2013-10-21 NOTE — Anesthesia Preprocedure Evaluation (Signed)
Anesthesia Evaluation  Patient identified by MRN, date of birth, ID band Patient awake    Reviewed: Allergy & Precautions, H&P , NPO status , Patient's Chart, lab work & pertinent test results  Airway Mallampati: I TM Distance: >3 FB Neck ROM: Full    Dental   Pulmonary          Cardiovascular     Neuro/Psych    GI/Hepatic   Endo/Other    Renal/GU      Musculoskeletal   Abdominal   Peds  Hematology   Anesthesia Other Findings   Reproductive/Obstetrics                           Anesthesia Physical Anesthesia Plan  ASA: II  Anesthesia Plan: General   Post-op Pain Management:    Induction: Intravenous  Airway Management Planned: Oral ETT  Additional Equipment:   Intra-op Plan:   Post-operative Plan: Extubation in OR  Informed Consent: I have reviewed the patients History and Physical, chart, labs and discussed the procedure including the risks, benefits and alternatives for the proposed anesthesia with the patient or authorized representative who has indicated his/her understanding and acceptance.     Plan Discussed with: CRNA and Surgeon  Anesthesia Plan Comments:         Anesthesia Quick Evaluation

## 2013-10-21 NOTE — ED Provider Notes (Signed)
Medical screening examination/treatment/procedure(s) were performed by non-physician practitioner and as supervising physician I was immediately available for consultation/collaboration.   EKG Interpretation None       Danielle Campbell. Alvino Chapel, MD 10/21/13 [phone]

## 2013-10-21 NOTE — H&P (Signed)
Danielle Campbell is an 38 y.o. female.   Chief Complaint: abdominal pain and nausea. HPI: Pt has had abdominal pain and nausea on and off for two weeks.  It got really bad and she presented to the ED on 10/19/13.  Labs were all normal but her Ultrasound, Cholelithiasis impacted within the gallbladder neck. Gallbladder wall thickening measuring up to 7.5 mm. Trace amount of  pericholecystic fluid. Distended gallbladder. Negative sonographic  Murphy sign, but the patient has been medicated.  Common bile duct:  Diameter: 6.3 mm without choledocholithiasis.  She was given a trial of PO's and did well here, but has had ongoing nausea, vomiting and pain since that time.  Her labs are pending, but we plan to admit for cholecystectomy hopefully later today.  She had some oatmeal and water around 7-8 AM.   History reviewed. No pertinent past medical history.  History reviewed. No pertinent past surgical history.  No family history on file. Social History:  reports that she has never smoked. She does not have any smokeless tobacco history on file. She reports that she does not drink alcohol or use illicit drugs.  Allergies: No Known Allergies  Prior to Admission medications   Medication Sig Start Date End Date Taking? Authorizing Provider  HYDROcodone-acetaminophen (NORCO/VICODIN) 5-325 MG per tablet Take 1 tablet by mouth every 4 (four) hours as needed for moderate pain. 10/19/13  Yes Peter S Dammen, PA-C  omeprazole (PRILOSEC) 20 MG capsule Take 20 mg by mouth 2 (two) times daily.    Yes Historical Provider, MD     Results for orders placed during the hospital encounter of 10/19/13 (from the past 48 hour(s))  COMPREHENSIVE METABOLIC PANEL     Status: Abnormal   Collection Time    10/19/13  5:05 PM      Result Value Ref Range   Sodium 138  137 - 147 mEq/L   Potassium 3.5 (*) 3.7 - 5.3 mEq/L   Chloride 100  96 - 112 mEq/L   CO2 22  19 - 32 mEq/L   Glucose, Bld 84  70 - 99 mg/dL   BUN 7  6  - 23 mg/dL   Creatinine, Ser 0.52  0.50 - 1.10 mg/dL   Calcium 8.4  8.4 - 10.5 mg/dL   Total Protein 7.9  6.0 - 8.3 g/dL   Albumin 3.6  3.5 - 5.2 g/dL   AST 30  0 - 37 U/L   ALT 39 (*) 0 - 35 U/L   Alkaline Phosphatase 83  39 - 117 U/L   Total Bilirubin 0.3  0.3 - 1.2 mg/dL   GFR calc non Af Amer >90  >90 mL/min   GFR calc Af Amer >90  >90 mL/min   Comment: (NOTE)     The eGFR has been calculated using the CKD EPI equation.     This calculation has not been validated in all clinical situations.     eGFR's persistently <90 mL/min signify possible Chronic Kidney     Disease.   Anion gap 16 (*) 5 - 15  CBC WITH DIFFERENTIAL     Status: Abnormal   Collection Time    10/19/13  5:05 PM      Result Value Ref Range   WBC 7.7  4.0 - 10.5 K/uL   RBC 4.00  3.87 - 5.11 MIL/uL   Hemoglobin 11.7 (*) 12.0 - 15.0 g/dL   HCT 36.0  36.0 - 46.0 %   MCV 90.0  78.0 -  100.0 fL   MCH 29.3  26.0 - 34.0 pg   MCHC 32.5  30.0 - 36.0 g/dL   RDW 12.6  11.5 - 15.5 %   Platelets 300  150 - 400 K/uL   Neutrophils Relative % 57  43 - 77 %   Neutro Abs 4.5  1.7 - 7.7 K/uL   Lymphocytes Relative 27  12 - 46 %   Lymphs Abs 2.1  0.7 - 4.0 K/uL   Monocytes Relative 9  3 - 12 %   Monocytes Absolute 0.7  0.1 - 1.0 K/uL   Eosinophils Relative 6 (*) 0 - 5 %   Eosinophils Absolute 0.5  0.0 - 0.7 K/uL   Basophils Relative 1  0 - 1 %   Basophils Absolute 0.0  0.0 - 0.1 K/uL  LIPASE, BLOOD     Status: None   Collection Time    10/19/13  5:05 PM      Result Value Ref Range   Lipase 37  11 - 59 U/L  URINALYSIS, ROUTINE W REFLEX MICROSCOPIC     Status: Abnormal   Collection Time    10/19/13  5:12 PM      Result Value Ref Range   Color, Urine YELLOW  YELLOW   APPearance CLEAR  CLEAR   Specific Gravity, Urine 1.014  1.005 - 1.030   pH 6.5  5.0 - 8.0   Glucose, UA NEGATIVE  NEGATIVE mg/dL   Hgb urine dipstick TRACE (*) NEGATIVE   Bilirubin Urine NEGATIVE  NEGATIVE   Ketones, ur NEGATIVE  NEGATIVE mg/dL    Protein, ur NEGATIVE  NEGATIVE mg/dL   Urobilinogen, UA 0.2  0.0 - 1.0 mg/dL   Nitrite NEGATIVE  NEGATIVE   Leukocytes, UA TRACE (*) NEGATIVE  URINE MICROSCOPIC-ADD ON     Status: None   Collection Time    10/19/13  5:12 PM      Result Value Ref Range   Squamous Epithelial / LPF RARE  RARE   WBC, UA 0-2  <3 WBC/hpf   RBC / HPF 0-2  <3 RBC/hpf  POC URINE PREG, ED     Status: None   Collection Time    10/19/13  5:18 PM      Result Value Ref Range   Preg Test, Ur NEGATIVE  NEGATIVE   Comment:            THE SENSITIVITY OF THIS     METHODOLOGY IS >24 mIU/mL   US Abdomen Complete  10/19/2013   CLINICAL DATA:  Right upper quadrant pain  EXAM: ULTRASOUND ABDOMEN COMPLETE  COMPARISON:  None.  FINDINGS: Gallbladder:  Cholelithiasis impacted within the gallbladder neck. Gallbladder wall thickening measuring up to 7.5 mm. Trace amount of pericholecystic fluid. Distended gallbladder. Negative sonographic Murphy sign, but the patient has been medicated.  Common bile duct:  Diameter: 6.3 mm without choledocholithiasis.  Liver:  No focal lesion identified. Within normal limits in parenchymal echogenicity.  IVC:  No abnormality visualized.  Pancreas:  Visualized portion unremarkable.  Spleen:  Size and appearance within normal limits.  Right Kidney:  Length: 10.8 cm. Echogenicity within normal limits. No mass or hydronephrosis visualized.  Left Kidney:  Length: 11.5 cm. Echogenicity within normal limits. No mass or hydronephrosis visualized.  Abdominal aorta:  No aneurysm visualized.  Other findings:  None.  IMPRESSION: 1. Impacted gallstone in the gallbladder neck with gallbladder distention, pericholecystic fluid and gallbladder wall thickening. Overall findings are most concerning for acute cholecystitis.  Electronically Signed   By: Kathreen Devoid   On: 10/19/2013 20:56    Review of Systems  Constitutional: Negative.   HENT: Negative.   Eyes: Negative.   Respiratory: Negative.   Cardiovascular:  Negative.   Gastrointestinal: Positive for nausea, vomiting and abdominal pain. Negative for heartburn, diarrhea, constipation, blood in stool and melena.  Genitourinary: Negative.   Musculoskeletal: Negative.   Skin: Negative.   Neurological: Negative.   Endo/Heme/Allergies: Negative.   Psychiatric/Behavioral: Negative.     Blood pressure 134/72, pulse 69, temperature 98.6 F (37 C), temperature source Oral, resp. rate 18, weight 67.132 kg (148 lb), last menstrual period 09/22/2013, SpO2 99.00%. Physical Exam  Constitutional: She is oriented to person, place, and time. She appears well-developed and well-nourished. She appears distressed (very anxious and teary).  HENT:  Head: Normocephalic and atraumatic.  Eyes: Conjunctivae and EOM are normal. Pupils are equal, round, and reactive to light.  Neck: Normal range of motion. Neck supple.  Cardiovascular: Normal rate, regular rhythm, normal heart sounds and intact distal pulses.   Respiratory: Effort normal and breath sounds normal.  GI: There is tenderness (right side RUQ).  Musculoskeletal: She exhibits no edema and no tenderness.  Neurological: She is alert and oriented to person, place, and time. No cranial nerve deficit.  Skin: Skin is warm and dry. No rash noted. She is not diaphoretic. No erythema. No pallor.  Psychiatric: She has a normal mood and affect. Her behavior is normal. Judgment and thought content normal.     Assessment/Plan Cholelithiasis with cholecystitis.  Plan:  Surgery hopefully today.  Kyian Obst 10/21/2013, 12:17 PM

## 2013-10-21 NOTE — ED Notes (Signed)
Seen 2 days for same pain dx w/ gall stones states is still hurting  And dizzy

## 2013-10-21 NOTE — Op Note (Signed)
10/21/2013  3:29 PM  PATIENT:  Danielle Campbell  38 y.o. female  PRE-OPERATIVE DIAGNOSIS:  CHOLECYSTITIS  POST-OPERATIVE DIAGNOSIS:  CHOLECYSTITIS  PROCEDURE:  Procedure(s): LAPAROSCOPIC CHOLECYSTECTOMY WITH INTRAOPERATIVE CHOLANGIOGRAM (N/A)  SURGEON:  Surgeon(s) and Role:    * Ralene Ok, MD - Primary  PHYSICIAN ASSISTANT:   ASSISTANTS: none   ANESTHESIA:   local and general  EBL:  Total I/O In: 1000 [I.V.:1000] Out: -   BLOOD ADMINISTERED:none  DRAINS: none   LOCAL MEDICATIONS USED:  BUPIVICAINE   SPECIMEN:  Source of Specimen:  Gallbladder   DISPOSITION OF SPECIMEN:  PATHOLOGY  COUNTS:  YES  TOURNIQUET:  * No tourniquets in log *  DICTATION: .Dragon Dictation  Details of the procedure: The patient was taken to the operating and placed in the supine position with bilateral SCDs in place. A time out was called and all facts were verified. A pneumoperitoneum was obtained via A Veress needle technique to a pressure of 76mm of mercury. A 69mm trochar was then placed in the right upper quadrant under visualization, and there were no injuries to any abdominal organs. A 11 mm port was then placed in the umbilical region after infiltrating with local anesthesia under direct visualization. A second epigastric port was placed under direct visualization. The gallbladder was identified and retracted, the peritoneum was then sharply dissected from the gallbladder and this dissection was carried down to Calot's triangle. The cystic duct was identified and stripped away circumferentially and seen going into the gallbladder 360, the critical angle was obtained. It was noted to be very dilated and large. A Cook catheter was used to perform an intraoperative cholangiogram. The cystic duct and common bile duct were seen free of filling defects. 2 clips were placed proximally one distally and the cystic duct transected. The cystic artery was identified and 2 clips placed  proximally and one distally and transected. We then proceeded to remove the gallbladder off the hepatic fossa with Bovie cautery. A retrieval bag was then placed in the abdomen and gallbladder placed in the bag. The hepatic fossa was then reexamined and hemostasis was achieved with Bovie cautery and was excellent at this portion of the case. The subhepatic fossa and perihepatic fossa was then irrigated until the effluent was clear. The 11 mm trocar fascia was reapproximated with the Endo Close #1 Vicryl x4. The pneumoperitoneum was evacuated and all trochars removed under direct visulalization. The skin was then closed with 4-0 Monocryl and the skin dressed with Steri-Strips, gauze, and tape. The patient was awaken from general anesthesia and taken to the recovery room in stable condition.    PLAN OF CARE: Admit to inpatient   PATIENT DISPOSITION:  PACU - hemodynamically stable.   Delay start of Pharmacological VTE agent (>24hrs) due to surgical blood loss or risk of bleeding: not applicable

## 2013-10-22 ENCOUNTER — Encounter (HOSPITAL_COMMUNITY): Payer: Self-pay | Admitting: General Surgery

## 2013-10-22 MED ORDER — OXYCODONE-ACETAMINOPHEN 5-325 MG PO TABS: 1.0000 | ORAL_TABLET | ORAL | Status: DC | PRN

## 2013-10-22 MED ORDER — IBUPROFEN 200 MG PO TABS: ORAL_TABLET | ORAL | Status: DC

## 2013-10-22 MED ORDER — ACETAMINOPHEN 325 MG PO TABS: 650.0000 mg | ORAL_TABLET | Freq: Four times a day (QID) | ORAL | Status: DC | PRN

## 2013-10-22 MED ORDER — IBUPROFEN 600 MG PO TABS: 600.0000 mg | ORAL_TABLET | Freq: Four times a day (QID) | ORAL | Status: DC | PRN

## 2013-10-22 NOTE — Progress Notes (Signed)
I have seen and examined the pt and agree with PA-Jenning's progress note. Looks home F/u in 2 weeks

## 2013-10-22 NOTE — Discharge Instructions (Signed)
[facility] ______[facility] °LAPAROSCOPIC SURGERY: POST OP INSTRUCTIONS °Always review your discharge instruction sheet given to you by the facility where your surgery was performed. °IF YOU HAVE DISABILITY OR FAMILY LEAVE FORMS, YOU MUST BRING THEM TO THE OFFICE FOR PROCESSING.   °DO NOT GIVE THEM TO YOUR DOCTOR. ° °1. A prescription for pain medication may be given to you upon discharge.  Take your pain medication as prescribed, if needed.  If narcotic pain medicine is not needed, then you may take acetaminophen (Tylenol) or ibuprofen (Advil) as needed. °2. Take your usually prescribed medications unless otherwise directed. °3. If you need a refill on your pain medication, please contact your pharmacy.  They will contact our office to request authorization. Prescriptions will not be filled after 5pm or on week-ends. °4. You should follow a light diet the first few days after arrival home, such as soup and crackers, etc.  Be sure to include lots of fluids daily. °5. Most patients will experience some swelling and bruising in the area of the incisions.  Ice packs will help.  Swelling and bruising can take several days to resolve.  °6. It is common to experience some constipation if taking pain medication after surgery.  Increasing fluid intake and taking a stool softener (such as Colace) will usually help or prevent this problem from occurring.  A mild laxative (Milk of Magnesia or Miralax) should be taken according to package instructions if there are no bowel movements after 48 hours. °7. Unless discharge instructions indicate otherwise, you may remove your bandages 24-48 hours after surgery, and you may shower at that time.  You may have steri-strips (small skin tapes) in place directly over the incision.  These strips should be left on the skin for 7-10 days.  If your surgeon used skin glue on the incision, you may shower in 24 hours.  The glue will flake off over the next 2-3 weeks.  Any sutures or  staples will be removed at the office during your follow-up visit. °8. ACTIVITIES:  You may resume regular (light) daily activities beginning the next day--such as daily self-care, walking, climbing stairs--gradually increasing activities as tolerated.  You may have sexual intercourse when it is comfortable.  Refrain from any heavy lifting or straining until approved by your doctor. °a. You may drive when you are no longer taking prescription pain medication, you can comfortably wear a seatbelt, and you can safely maneuver your car and apply brakes. °b. RETURN TO WORK:  __________________________________________________________ °9. You should see your doctor in the office for a follow-up appointment approximately 2-3 weeks after your surgery.  Make sure that you call for this appointment within a day or two after you arrive home to insure a convenient appointment time. °10. OTHER INSTRUCTIONS: __________________________________________________________________________________________________________________________ __________________________________________________________________________________________________________________________ °WHEN TO CALL YOUR DOCTOR: °1. Fever over 101.0 °2. Inability to urinate °3. Continued bleeding from incision. °4. Increased pain, redness, or drainage from the incision. °5. Increasing abdominal pain ° °The clinic staff is available to answer your questions during regular business hours.  Please don’t hesitate to call and ask to speak to one of the nurses for clinical concerns.  If you have a medical emergency, go to the nearest emergency room or call 911.  A surgeon from [facility] is always on call at the hospital. °[address], [address], [place], [place]  [address] ? [address] [address], [place],    [address] °[phone] ? [phone] ? FAX [phone] °Web site:   [email or web address]  Colecistectoma laparoscpica - Cuidados  posteriores (Laparoscopic Cholecystectomy, Care After) Siga estas instrucciones durante las prximas semanas. Estas indicaciones le proporcionan informacin general acerca de cmo deber cuidarse despus del procedimiento. El mdico tambin podr darle instrucciones ms especficas. El tratamiento ha sido planificado segn las prcticas mdicas actuales, pero en algunos casos pueden ocurrir problemas. Comunquese con el mdico si tiene algn problema o tiene dudas despus del procedimiento. QU ESPERAR DESPUS DEL PROCEDIMIENTO Despus del procedimiento, es comn tener las siguientes sensaciones:  Dolor en los lugares de la incisin. Le darn analgsicos para Financial controller.  Nuseas o vmitos leves. Estos sntomas deberan mejorar despus de las primeras 24horas.  Meteorismo y Personal assistant en el hombro debido al gas que se [place] durante el procedimiento. Estos sntomas mejorarn despus de las primeras 24horas. INSTRUCCIONES PARA EL CUIDADO EN EL HOGAR   Cambie los apsitos (vendajes) tal como le indic el mdico.  Mantenga la herida limpia y seca. Puede lavar la herida suavemente con agua y [place]. Seque dando palmaditas suaves.  No se bae en la baera, no practique natacin ni use el jacuzzy durante 2semanas o hasta que lo autorice el [place] solo medicamentos de venta libre o recetados, segn las indicaciones del mdico.  Siga su dieta normal segn las indicaciones de su mdico.  No levante ningn objeto que pese ms de 10libras (4,5kg) hasta que el mdico lo autorice.  No practique deportes de contacto durante 1semana o hasta que el mdico lo autorice. SOLICITE ATENCIN MDICA SI:   Presenta enrojecimiento, hinchazn o aumento del dolor en la herida.  Observa una secrecin de color blanco amarillento (pus) en la herida.  Hay una secrecin en la herida que dura ms de 1da.  Advierte un olor ftido que proviene de la herida o del vendaje.  Los cortes  quirrgicos (incisiones) se abren. SOLICITE ATENCIN MDICA DE INMEDIATO SI:   Le aparece una erupcin cutnea.  Tiene dificultad para respirar.  Siente dolor en el pecho.  Tiene fiebre.  Nota un incremento del dolor en los hombros (en la zona donde van los breteles).  Presenta episodios de mareos o se siente dbil cuando est de pie.  Siente un dolor abdominal intenso.  Tiene Higher education careers adviser (nuseas) o vomita y esto dura ms de 1da. Document Released: 11/01/2010 Document Revised: 01/09/2013 [facility] Patient Information 2015 [place]. This information is not intended to replace advice given to you by your health care provider. Make sure you discuss any questions you have with your health care provider.  No lifting over 20 pounds for 3 weeks.

## 2013-10-22 NOTE — Discharge Summary (Signed)
Physician Discharge Summary  Patient ID: Danielle Campbell MRN: [id number] DOB/AGE: Jan 14, 1976 38 y.o.  Admit date: 10/21/2013 Discharge date: 10/22/2013  Admission Diagnoses:  CHOLECYSTITIS/cholelithiasis  Discharge Diagnoses:  Same  Active Problems:   Cholelithiasis and acute cholecystitis without obstruction   PROCEDURES: LAPAROSCOPIC CHOLECYSTECTOMY WITH INTRAOPERATIVE CHOLANGIOGRAM (N/A) Ralene Ok, MD,10/21/2013.    Hospital Course: Pt has had abdominal pain and nausea on and off for two weeks. It got really bad and she presented to the ED on 10/19/13. Labs were all normal but her Ultrasound, Cholelithiasis impacted within the gallbladder neck. Gallbladder wall thickening measuring up to 7.5 mm. Trace amount of pericholecystic fluid. Distended gallbladder. Negative sonographic Murphy sign, but the patient has been medicated. Common bile duct: Diameter: 6.3 mm without choledocholithiasis. She was given a trial of PO's and did well here, but has had ongoing nausea, vomiting and pain since that time. Her labs are pending, but we plan to admit for cholecystectomy hopefully later today. She had some oatmeal and water around 7-8 AM. She was admitted and taken to the OR later that afternoon. She has done well post op.  We plan to send her home after lunch.   Disposition: 01-Home or Self Care     Medication List    STOP taking these medications       HYDROcodone-acetaminophen 5-325 MG per tablet  Commonly known as:  NORCO/VICODIN      TAKE these medications       acetaminophen 325 MG tablet  Commonly known as:  TYLENOL  Take 2 tablets (650 mg total) by mouth every 6 (six) hours as needed (Do not take more than 4000 mg of Tylenol (acetaminophen) per day.  It is in your prescribed pain medicine.).     ibuprofen 200 MG tablet  Commonly known as:  ADVIL,MOTRIN  You may take 2-3 tablets every 6 hours as needed for pain.  You can but this over the counter at any store.      omeprazole 20 MG capsule  Commonly known as:  PRILOSEC  Take 20 mg by mouth 2 (two) times daily.     oxyCODONE-acetaminophen 5-325 MG per tablet  Commonly known as:  PERCOCET/ROXICET  Take 1-2 tablets by mouth every 4 (four) hours as needed for moderate pain.           Follow-up Information   Follow up with [facility] On 11/12/2013. (Your appointment is at 3PM be at the office 30 minutes early for check in.)    Contact information:   [address] [place] [address] [phone]       Signed: Earnstine Regal 10/22/2013, 10:08 AM

## 2013-10-22 NOTE — Anesthesia Postprocedure Evaluation (Signed)
  Anesthesia Post-op Note  Patient: Danielle Campbell  Procedure(s) Performed: Procedure(s): LAPAROSCOPIC CHOLECYSTECTOMY WITH INTRAOPERATIVE CHOLANGIOGRAM (N/A)  Patient Location: PACU  Anesthesia Type:General  Level of Consciousness: awake, alert , oriented and patient cooperative  Airway and Oxygen Therapy: Patient Spontanous Breathing and Patient connected to nasal cannula oxygen  Post-op Pain: mild  Post-op Assessment: Post-op Vital signs reviewed, Patient's Cardiovascular Status Stable, Respiratory Function Stable, Patent Airway, No signs of Nausea or vomiting and Pain level controlled  Post-op Vital Signs: Reviewed and stable  Last Vitals:  Filed Vitals:   10/22/13 0901  BP: 106/56  Pulse: 91  Temp: 37 C  Resp: 16    Complications: No apparent anesthesia complications

## 2013-10-22 NOTE — Progress Notes (Signed)
Discussed discharge summary with patient and patient son. Reviewed all medications with patient. Patient received Rx. All questions from patient were answered. Patient will transfer to discharge lounge to wait for ride to come.

## 2013-10-22 NOTE — Progress Notes (Signed)
1 Day Post-Op  Subjective: She is sore but otherwise doing well.  She had some liquids but not had time to order breakfast.  Objective: Vital signs in last 24 hours: Temp:  [97.6 F (36.4 C)-98.6 F (37 C)] 98.4 F (36.9 C) (07/21 0516) Pulse Rate:  [64-83] 74 (07/21 0516) Resp:  [12-20] 20 (07/21 0516) BP: (103-134)/(50-74) 127/67 mmHg (07/21 0516) SpO2:  [96 %-100 %] 100 % (07/21 0516) Weight:  [67.132 kg (148 lb)] 67.132 kg (148 lb) (07/20 1801)   480 Po Diet: Regular Afebrile, VSS No labs this AM IOC:  Contrast fills the biliary tree and duodenum compatible with patency. Extravasation of contrast at the cystic duct catheter insertion site is noted. The upper common bile duct and the lower common hepatic duct are obscured body extravasated contrast.   Intake/Output from previous day: 07/20 0701 - 07/21 0700 In: 3430.7 [P.O.:480; I.V.:2950.7] Out: 2200 [Urine:2200] Intake/Output this shift:    General appearance: alert, cooperative and no distress GI: soft sore, port sites look fine.  Lab Results:   Recent Labs  10/19/13 1705 10/21/13 1245  WBC 7.7 5.2  HGB 11.7* 11.5*  HCT 36.0 35.1*  PLT 300 306    BMET  Recent Labs  10/19/13 1705 10/21/13 1245  NA 138 140  K 3.5* 3.5*  CL 100 100  CO2 22 25  GLUCOSE 84 97  BUN 7 9  CREATININE 0.52 0.50  CALCIUM 8.4 8.8   PT/INR No results found for this basename: LABPROT, INR,  in the last 72 hours   Recent Labs Lab 10/19/13 1705 10/21/13 1245  AST 30 694*  ALT 39* 543*  ALKPHOS 83 254*  BILITOT 0.3 0.8  PROT 7.9 8.0  ALBUMIN 3.6 3.7     Lipase     Component Value Date/Time   LIPASE 33 10/21/2013 1245     Studies/Results: Dg Cholangiogram Operative  10/21/2013   CLINICAL DATA:  Laparoscopic cholecystectomy  EXAM: INTRAOPERATIVE CHOLANGIOGRAM  TECHNIQUE: Cholangiographic images from the C-arm fluoroscopic device were submitted for interpretation post-operatively. Please see the procedural  report for the amount of contrast and the fluoroscopy time utilized.  COMPARISON:  None.  FINDINGS: Contrast fills the biliary tree and duodenum compatible with patency. Extravasation of contrast at the cystic duct catheter insertion site is noted. The upper common bile duct and the lower common hepatic duct are obscured body extravasated contrast.  IMPRESSION: Patent biliary tree as described above.   Electronically Signed   By: Maryclare Bean M.D.   On: 10/21/2013 15:57    Medications:    Assessment/Plan CHOLECYSTITIS/cholelithiasis LAPAROSCOPIC CHOLECYSTECTOMY WITH INTRAOPERATIVE CHOLANGIOGRAM (N/A) Ralene Ok, MD,10/21/2013.      Plan:  We will let her get up and move around.  If she does well we will let her go home later today.       LOS: 1 day    Kenny Rea 10/22/2013

## 2013-10-23 NOTE — ED Provider Notes (Signed)
Medical screening examination/treatment/procedure(s) were performed by non-physician practitioner and as supervising physician I was immediately available for consultation/collaboration.   EKG Interpretation None       Virgel Manifold, MD 10/23/13 1454

## 2013-11-12 ENCOUNTER — Encounter (INDEPENDENT_AMBULATORY_CARE_PROVIDER_SITE_OTHER): Payer: Self-pay

## 2013-11-12 ENCOUNTER — Ambulatory Visit (INDEPENDENT_AMBULATORY_CARE_PROVIDER_SITE_OTHER): Payer: Medicaid Other | Admitting: General Surgery

## 2013-11-12 VITALS — BP 116/72 | HR 72 | Temp 98.3°F | Resp 14 | Ht 63.25 in | Wt 150.0 lb

## 2013-11-12 DIAGNOSIS — K8010 Calculus of gallbladder with chronic cholecystitis without obstruction: Secondary | ICD-10-CM

## 2013-11-12 DIAGNOSIS — Z9049 Acquired absence of other specified parts of digestive tract: Secondary | ICD-10-CM

## 2013-11-12 DIAGNOSIS — Z9889 Other specified postprocedural states: Secondary | ICD-10-CM

## 2013-11-12 NOTE — Progress Notes (Signed)
  Subjective: ARRYN Campbell is a 38 y.o. female who had a laparoscopic appendectomy with on 10/21/13 by Dr. Rosendo Gros.  The pathology report confirmed Chronic cholecystitis and cholelithiasis, there is no evidence of malignancy.  The patient is tolerating their diet well and is having no severe pain.  Bowel function is good.  No problems with the wounds.  Pt is returning to normal activity / work.   Objective: Vital signs in last 24 hours: Reviewed   PE: General:  Alert, NAD, pleasant Abd: soft, NT/ND, +bs, incisions appear well-healed with no sign of infection or bleeding, removed 2 steristrips   Assessment/Plan  1.  S/P Laparoscopic Appendectomy: doing well, may resume regular activity without restrictions.  Start to ease in to your exercise program.  Pt will follow up with [place] [employer] and knows to call with questions or concerns.       Coralie Keens, PA-C 11/12/2013

## 2013-11-12 NOTE — Patient Instructions (Signed)
She may resume a regular diet and full activity.  She may follow-up on a as needed basis.  Slowly return to exercise, no lifting >40lbs for 3 more weeks.

## 2015-07-13 ENCOUNTER — Other Ambulatory Visit: Payer: Self-pay | Admitting: Nurse Practitioner

## 2015-07-13 DIAGNOSIS — Z1231 Encounter for screening mammogram for malignant neoplasm of breast: Secondary | ICD-10-CM

## 2015-07-20 ENCOUNTER — Ambulatory Visit
Admission: RE | Admit: 2015-07-20 | Discharge: 2015-07-20 | Disposition: A | Discharge status: Home / Self Care | Payer: No Typology Code available for payment source | Source: Ambulatory Visit | Attending: Nurse Practitioner | Admitting: Nurse Practitioner

## 2015-07-20 DIAGNOSIS — Z1231 Encounter for screening mammogram for malignant neoplasm of breast: Secondary | ICD-10-CM

## 2016-04-04 DIAGNOSIS — N946 Dysmenorrhea, unspecified: Secondary | ICD-10-CM

## 2016-04-04 HISTORY — DX: Dysmenorrhea, unspecified: N94.6

## 2016-11-04 ENCOUNTER — Emergency Department (HOSPITAL_COMMUNITY)
Admission: EM | Admit: 2016-11-04 | Discharge: 2016-11-04 | Disposition: A | Discharge status: Home / Self Care | Payer: No Typology Code available for payment source | Attending: Emergency Medicine | Admitting: Emergency Medicine

## 2016-11-04 ENCOUNTER — Encounter (HOSPITAL_COMMUNITY): Payer: Self-pay | Admitting: *Deleted

## 2016-11-04 DIAGNOSIS — Z79899 Other long term (current) drug therapy: Secondary | ICD-10-CM | POA: Insufficient documentation

## 2016-11-04 DIAGNOSIS — N946 Dysmenorrhea, unspecified: Secondary | ICD-10-CM | POA: Insufficient documentation

## 2016-11-04 LAB — CBC
HCT: 37.7 % (ref 36.0–46.0)
Hemoglobin: 12.8 g/dL (ref 12.0–15.0)
MCH: 30 pg (ref 26.0–34.0)
MCHC: 34 g/dL (ref 30.0–36.0)
MCV: 88.5 fL (ref 78.0–100.0)
PLATELETS: 323 10*3/uL (ref 150–400)
RBC: 4.26 MIL/uL (ref 3.87–5.11)
RDW: 13.2 % (ref 11.5–15.5)
WBC: 7.5 10*3/uL (ref 4.0–10.5)

## 2016-11-04 LAB — URINALYSIS, ROUTINE W REFLEX MICROSCOPIC
Bilirubin Urine: NEGATIVE
Glucose, UA: NEGATIVE mg/dL
Hgb urine dipstick: NEGATIVE
KETONES UR: NEGATIVE mg/dL
Leukocytes, UA: NEGATIVE
NITRITE: NEGATIVE
Protein, ur: NEGATIVE mg/dL
Specific Gravity, Urine: 1.002 — ABNORMAL LOW (ref 1.005–1.030)
pH: 6 (ref 5.0–8.0)

## 2016-11-04 LAB — BASIC METABOLIC PANEL
Anion gap: 7 (ref 5–15)
BUN: 9 mg/dL (ref 6–20)
CO2: 23 mmol/L (ref 22–32)
CREATININE: 0.63 mg/dL (ref 0.44–1.00)
Calcium: 9.1 mg/dL (ref 8.9–10.3)
Chloride: 106 mmol/L (ref 101–111)
GFR calc Af Amer: >60 mL/min (ref >60)
GLUCOSE: 87 mg/dL (ref 65–99)
Potassium: 3.5 mmol/L (ref 3.5–5.1)
SODIUM: 136 mmol/L (ref 135–145)

## 2016-11-04 LAB — I-STAT BETA HCG BLOOD, ED (MC, WL, AP ONLY): I-stat hCG, quantitative: <5 m[IU]/mL (ref <5)

## 2016-11-04 MED ORDER — ONDANSETRON HCL 4 MG PO TABS: 4.0000 mg | ORAL_TABLET | Freq: Three times a day (TID) | ORAL | 0 refills | Status: DC | PRN

## 2016-11-04 NOTE — Discharge Instructions (Signed)
Continua a tomar Microbiologist.  Puede tomar la pastilla por nausea  Es muy importante que da  una cita con el ginecologo para evaluar su sintomas Regresa a emergencia si tiene fiebre, dolor de pecho persistente, mas dolor, o nuevos sintomas.   Continue to drink lots of water to remain well-hydrated. You may take Zofran as needed for nausea. It is very important that you make an appointment with a gynecologist for further evaluation of your symptoms. Return to the emergency room if you have fever, persistent chest pain, increasing pain, or new symptoms.

## 2016-11-04 NOTE — ED Provider Notes (Signed)
[facility] DEPT Provider Note   CSN: [id number] Arrival date & time: 11/04/16  1355     History   Chief Complaint Chief Complaint  Patient presents with  . Vaginal Bleeding  . Fatigue    HPI Danielle Campbell is a 41 y.o. female presenting with 1 wk tiredness and abd cramping.   Pt states that she started her period on 07/29, and it has been heavier than normal. She has associated increased tiredness, headaches, abd cramping (irmpved with midol), mild nausea, and intermittent palpitations (not currently). Her last period was also heavier than normal, and she has similar sxs which resolved with the end of her period. Pt is still finishing her period currently. She doe snot have a history of dysmenorrhea or heavy bleeding. She thinks she has been going through menopause, as she has been having hot flashes recently. She has been taking midol with relief of her cramping. She has not had relief with iron pills or estroven (menopause OTC pills). Pt has a gyn that she sees annually, and was last seen last year. She can follow up with this provider. She denies fevers, chill, CP, SOB, vomiting, urinary sxs, or abnormal bowel movements.   HPI  Past Medical History:  Diagnosis Date  . GERD (gastroesophageal reflux disease)     Patient Active Problem List   Diagnosis Date Noted  . Cholelithiasis and acute cholecystitis without obstruction 10/21/2013    Past Surgical History:  Procedure Laterality Date  . CHOLECYSTECTOMY N/A 10/21/2013   Procedure: LAPAROSCOPIC CHOLECYSTECTOMY WITH INTRAOPERATIVE CHOLANGIOGRAM;  Surgeon: Ralene Ok, MD;  Location: [facility];  Service: General;  Laterality: N/A;  . LAPAROSCOPIC CHOLECYSTECTOMY  10/21/2013   w/IOC    OB History    No data available       Home Medications    Prior to Admission medications   Medication Sig Start Date End Date Taking? Authorizing Provider  ferrous sulfate (FEOSOL) 325 (65 FE) MG tablet Take 325 mg by mouth  daily with breakfast.   Yes [provider]  Ibuprofen (MIDOL) 200 MG CAPS Take 200 mg by mouth every 6 (six) hours as needed.   Yes [provider]  ondansetron (ZOFRAN) 4 MG tablet Take 1 tablet (4 mg total) by mouth every 8 (eight) hours as needed for nausea or vomiting. 11/04/16   Parrish Bonn, PA-C    Family History History reviewed. No pertinent family history.  Social History Social History  Substance Use Topics  . Smoking status: Never Smoker  . Smokeless tobacco: Never Used  . Alcohol use No     Allergies   Patient has no known allergies.   Review of Systems Review of Systems  Constitutional: Negative for chills and fever.  HENT: Negative for sore throat.   Eyes: Negative for visual disturbance.  Respiratory: Negative for cough, chest tightness and shortness of breath.   Cardiovascular: Positive for palpitations (not currently). Negative for chest pain.  Gastrointestinal: Positive for nausea. Negative for constipation, diarrhea and vomiting.  Genitourinary: Positive for vaginal bleeding. Negative for dysuria, frequency and hematuria.       Menstrual cramps  Musculoskeletal: Negative for back pain and neck pain.  Skin: Negative for rash and wound.  Neurological: Positive for headaches. Negative for numbness.  Hematological: Does not bruise/bleed easily.  Psychiatric/Behavioral: Negative for confusion.     Physical Exam Updated Vital Signs BP 104/75 (BP Location: Right Arm)   Pulse 75   Temp 98 F (36.7 C) (Oral)   Resp  16   LMP 10/30/2016   SpO2 100%   Physical Exam  Constitutional: She is oriented to person, place, and time. She appears well-developed and well-nourished. No distress.  HENT:  Head: Normocephalic and atraumatic.  Mouth/Throat: Uvula is midline, oropharynx is clear and moist and mucous membranes are normal.  Eyes: Pupils are equal, round, and reactive to light. Conjunctivae and EOM are normal.  Neck: Normal range of  motion. Neck supple. No thyromegaly present.  Cardiovascular: Normal rate, regular rhythm and intact distal pulses.   Pulmonary/Chest: Effort normal and breath sounds normal. No respiratory distress. She has no wheezes. She exhibits no tenderness.  Abdominal: Soft. Bowel sounds are normal. She exhibits no distension. There is no tenderness. There is no guarding.  Musculoskeletal: Normal range of motion.  Lymphadenopathy:    She has no cervical adenopathy.  Neurological: She is alert and oriented to person, place, and time.  Skin: Skin is warm and dry.  Psychiatric: She has a normal mood and affect.  Nursing note and vitals reviewed.    ED Treatments / Results  Labs (all labs ordered are listed, but only abnormal results are displayed) Labs Reviewed  URINALYSIS, ROUTINE W REFLEX MICROSCOPIC - Abnormal; Notable for the following:       Result Value   Specific Gravity, Urine 1.002 (*)    All other components within normal limits  BASIC METABOLIC PANEL  CBC  I-STAT BETA HCG BLOOD, ED ([facility], [facility], [facility] ONLY)    EKG  EKG Interpretation None       Radiology No results found.  Procedures Procedures (including critical care time)  Medications Ordered in ED Medications - No data to display   Initial Impression / Assessment and Plan / ED Course  I have reviewed the triage vital signs and the nursing notes.  Pertinent labs & imaging results that were available during my care of the patient were reviewed by me and considered in my medical decision making (see chart for details).     Pt presenting with increased menstrual bleeding causing sxs for the 2nd month. Pt has not seen gyn since the change in menstruation. Physical exam reassuring. Basic labs and UA reassuring. At this time, I believe pt need further follow up with gyn for further management of periods, and for TSH testing. Doubt infection as cause. Doubt cardiac cause for palpitations. Discussed importance of staying well  hydrated. Zofran given for nausea. Pt appears safe for discharge. Return precautions given. Pt states she understands and agrees to plan.   Final Clinical Impressions(s) / ED Diagnoses   Final diagnoses:  Dysmenorrhea    New Prescriptions Discharge Medication List as of 11/04/2016  4:10 PM    START taking these medications   Details  ondansetron (ZOFRAN) 4 MG tablet Take 1 tablet (4 mg total) by mouth every 8 (eight) hours as needed for nausea or vomiting., Starting Fri 11/04/2016, Print         Thornton, Fate Caster, PA-C 11/04/16 2337    Milton Ferguson, MD 11/05/16 1443

## 2016-11-04 NOTE — ED Notes (Signed)
Pt A/O x4, ambulatory. Verbalizes understanding of d/c instructions, follow up care, and medications. Son at bedside for education and d/c instructions. All belongings with pt upon departure.

## 2016-11-04 NOTE — ED Triage Notes (Signed)
Pt reports that her menstrual started on 7/29 and had two days of heavy bleeding and severe cramping. Pt now is having fatigue and weakness. Has tried taking iron with no relief.

## 2016-12-14 ENCOUNTER — Other Ambulatory Visit: Payer: Self-pay | Admitting: Obstetrics & Gynecology

## 2016-12-14 DIAGNOSIS — Z1231 Encounter for screening mammogram for malignant neoplasm of breast: Secondary | ICD-10-CM

## 2016-12-28 ENCOUNTER — Ambulatory Visit
Admission: RE | Admit: 2016-12-28 | Discharge: 2016-12-28 | Disposition: A | Discharge status: Home / Self Care | Payer: No Typology Code available for payment source | Source: Ambulatory Visit | Attending: Obstetrics & Gynecology | Admitting: Obstetrics & Gynecology

## 2016-12-28 DIAGNOSIS — Z1231 Encounter for screening mammogram for malignant neoplasm of breast: Secondary | ICD-10-CM

## 2016-12-29 ENCOUNTER — Other Ambulatory Visit: Payer: Self-pay | Admitting: Obstetrics & Gynecology

## 2016-12-29 DIAGNOSIS — R928 Other abnormal and inconclusive findings on diagnostic imaging of breast: Secondary | ICD-10-CM

## 2017-02-17 ENCOUNTER — Other Ambulatory Visit (HOSPITAL_COMMUNITY): Payer: Self-pay | Admitting: *Deleted

## 2017-02-17 DIAGNOSIS — R928 Other abnormal and inconclusive findings on diagnostic imaging of breast: Secondary | ICD-10-CM

## 2017-03-16 ENCOUNTER — Encounter (HOSPITAL_COMMUNITY): Payer: Self-pay

## 2017-03-16 ENCOUNTER — Ambulatory Visit
Admission: RE | Admit: 2017-03-16 | Discharge: 2017-03-16 | Disposition: A | Discharge status: Home / Self Care | Payer: No Typology Code available for payment source | Source: Ambulatory Visit | Attending: Obstetrics and Gynecology | Admitting: Obstetrics and Gynecology

## 2017-03-16 ENCOUNTER — Ambulatory Visit (HOSPITAL_COMMUNITY)
Admission: RE | Admit: 2017-03-16 | Discharge: 2017-03-16 | Disposition: A | Discharge status: Home / Self Care | Payer: Self-pay | Source: Ambulatory Visit | Attending: Obstetrics and Gynecology | Admitting: Obstetrics and Gynecology

## 2017-03-16 VITALS — BP 150/98 | Temp 98.2°F | Ht 62.0 in | Wt 167.8 lb

## 2017-03-16 DIAGNOSIS — N6315 Unspecified lump in the right breast, overlapping quadrants: Secondary | ICD-10-CM

## 2017-03-16 DIAGNOSIS — R928 Other abnormal and inconclusive findings on diagnostic imaging of breast: Secondary | ICD-10-CM

## 2017-03-16 DIAGNOSIS — N6310 Unspecified lump in the right breast, unspecified quadrant: Secondary | ICD-10-CM

## 2017-03-16 DIAGNOSIS — Z1239 Encounter for other screening for malignant neoplasm of breast: Secondary | ICD-10-CM

## 2017-03-16 NOTE — Progress Notes (Signed)
Patient referred to [facility] by the [facility] due to recommending additional imaging. Screening mammogram completed 12/28/2016.  Pap Smear: Pap smear not completed today. Last Pap smear was in September 2018 at the [facility] and normal per patient. Per patient has a history of an abnormal Pap smear 13 years ago that a colposcopy was completed for follow-up. Patient stated all her Pap smears have been normal since and that she has had at least three normal Pap smears since colposcopy. No Pap smear results are in Epic.  Physical exam: Breasts Breasts symmetrical. No skin abnormalities bilateral breasts. No nipple retraction bilateral breasts. No nipple discharge bilateral breasts. No lymphadenopathy. No lumps palpated left breast. Palpated a BB sized lump within the right breast at 6 o'clock right under the nipple. No complaints of pain or tenderness on exam. Referred patient to the [facility] for a right breast diagnostic mammogram and breast ultrasound per recommendation. Appointment scheduled for Thursday, March 16, 2017 at 1250.        Pelvic/Bimanual No Pap smear completed today since last Pap smear was in September 2018 per patient. Pap smear not indicated per BCCCP guidelines.   Smoking History: Patient has never smoked.  Patient Navigation: Patient education provided. Access to services provided for patient through [facility] program. Spanish interpreter provided.  Used Spanish interpreter [employer] from [place].

## 2017-03-16 NOTE — Patient Instructions (Signed)
Explained breast self awareness with Danielle Campbell. Patient did not need a Pap smear today due to last Pap smear was in September 2018 per patient. Let her know BCCCP will cover Pap smears every 3 years unless has a history of abnormal Pap smears.  Referred patient to the [facility] for a right breast diagnostic mammogram and breast ultrasound per recommendation. Appointment scheduled for Thursday, March 16, 2017 at 1250. [name] verbalized understanding.  [name], [name], RN 11:56 AM

## 2017-03-17 ENCOUNTER — Encounter (HOSPITAL_COMMUNITY): Payer: Self-pay

## 2020-12-17 ENCOUNTER — Other Ambulatory Visit: Payer: Self-pay | Admitting: Obstetrics and Gynecology

## 2020-12-17 DIAGNOSIS — Z1231 Encounter for screening mammogram for malignant neoplasm of breast: Secondary | ICD-10-CM

## 2020-12-18 ENCOUNTER — Other Ambulatory Visit: Payer: Self-pay | Admitting: Obstetrics and Gynecology

## 2020-12-18 DIAGNOSIS — Z1231 Encounter for screening mammogram for malignant neoplasm of breast: Secondary | ICD-10-CM

## 2020-12-24 ENCOUNTER — Ambulatory Visit
Admission: RE | Admit: 2020-12-24 | Discharge: 2020-12-24 | Disposition: A | Discharge status: Home / Self Care | Payer: No Typology Code available for payment source | Source: Ambulatory Visit | Attending: Obstetrics and Gynecology | Admitting: Obstetrics and Gynecology

## 2020-12-24 ENCOUNTER — Other Ambulatory Visit: Payer: Self-pay

## 2020-12-24 DIAGNOSIS — Z1231 Encounter for screening mammogram for malignant neoplasm of breast: Secondary | ICD-10-CM

## 2021-01-04 ENCOUNTER — Other Ambulatory Visit: Payer: Self-pay | Admitting: Obstetrics and Gynecology

## 2021-01-04 DIAGNOSIS — R928 Other abnormal and inconclusive findings on diagnostic imaging of breast: Secondary | ICD-10-CM

## 2021-01-07 ENCOUNTER — Encounter (INDEPENDENT_AMBULATORY_CARE_PROVIDER_SITE_OTHER): Payer: Self-pay

## 2021-01-07 ENCOUNTER — Ambulatory Visit
Admission: RE | Admit: 2021-01-07 | Discharge: 2021-01-07 | Disposition: A | Discharge status: Home / Self Care | Payer: No Typology Code available for payment source | Source: Ambulatory Visit | Attending: Obstetrics and Gynecology | Admitting: Obstetrics and Gynecology

## 2021-01-07 ENCOUNTER — Ambulatory Visit: Payer: Self-pay | Admitting: *Deleted

## 2021-01-07 ENCOUNTER — Other Ambulatory Visit: Payer: Self-pay | Admitting: Obstetrics and Gynecology

## 2021-01-07 ENCOUNTER — Other Ambulatory Visit: Payer: Self-pay

## 2021-01-07 VITALS — BP 124/82 | Wt 182.8 lb

## 2021-01-07 DIAGNOSIS — Z1211 Encounter for screening for malignant neoplasm of colon: Secondary | ICD-10-CM

## 2021-01-07 DIAGNOSIS — Z1239 Encounter for other screening for malignant neoplasm of breast: Secondary | ICD-10-CM

## 2021-01-07 DIAGNOSIS — R928 Other abnormal and inconclusive findings on diagnostic imaging of breast: Secondary | ICD-10-CM

## 2021-01-07 NOTE — Progress Notes (Signed)
[name] is a 45 y.o. female who presents to [facility] clinic today with no complaints. Patient referred to [facility] by the [facility] due to having a screening mammogram on 12/24/2020 that additional imaging of the right breast recommended for follow-up.   Pap Smear: Pap smear not completed today. Last Pap smear was 12/15/2020 at the [facility] clinic and was normal with negative HPV. Per patient has no history of an abnormal Pap smear. Last Pap smear result is not available in Epic. Last Pap smear result will be scanned into Epic.   Physical exam: Breasts Breasts symmetrical. No skin abnormalities bilateral breasts. No nipple retraction bilateral breasts. No nipple discharge bilateral breasts. No lymphadenopathy. No lumps palpated bilateral breasts. No complaints of pain or tenderness on exam.  MS DIGITAL SCREENING BILATERAL  Result Date: 12/28/2016 CLINICAL DATA:  Screening. EXAM: DIGITAL SCREENING BILATERAL MAMMOGRAM WITH CAD COMPARISON:  Previous exam(s). ACR Breast Density Category c: The breast tissue is heterogeneously dense, which may obscure small masses. FINDINGS: In the right breast, possible distortion warrants further evaluation. In the left breast, no findings suspicious for malignancy. Images were processed with CAD. IMPRESSION: Further evaluation is suggested for possible distortion in the right breast. RECOMMENDATION: Diagnostic mammogram and possibly ultrasound of the right breast. (Code:FI-R-88M) The patient will be contacted regarding the findings, and additional imaging will be scheduled. BI-RADS CATEGORY  0: Incomplete. Need additional imaging evaluation and/or prior mammograms for comparison. Electronically Signed   By: Nolon Nations M.D.   On: 12/28/2016 14:02   MM DIAG BREAST TOMO UNI RIGHT  Addendum Date: 03/16/2017   ADDENDUM REPORT: 03/16/2017 15:27 ADDENDUM: Recommendation should read bilateral screening mammograms in  9 months to resume annual mammogram schedule. Electronically Signed   By: Margarette Canada M.D.   On: 03/16/2017 15:27   Result Date: 03/16/2017 CLINICAL DATA:  45 year old female for further evaluation of possible right breast asymmetry on screening mammogram. EXAM: 2D DIGITAL DIAGNOSTIC RIGHT MAMMOGRAM WITH CAD AND ADJUNCT TOMO ULTRASOUND RIGHT BREAST COMPARISON:  Previous exam(s). ACR Breast Density Category c: The breast tissue is heterogeneously dense, which may obscure small masses. FINDINGS: 2D and 3D full field views of the right breast demonstrates no definite persistent abnormality in the area of the screening study finding. However, dense tissue in this area slightly limits evaluation. Mammographic images were processed with CAD. Targeted ultrasound is performed, showing normal dense fibroglandular tissue within the upper and outer right breast. No suspicious mass, distortion or abnormal shadowing noted. IMPRESSION: No persistent mammographic or sonographic abnormality in the area of the screening study finding. RECOMMENDATION: Bilateral screening mammograms in 1 year. I have discussed the findings and recommendations with the patient. Results were also provided in writing at the conclusion of the visit. If applicable, a reminder letter will be sent to the patient regarding the next appointment. BI-RADS CATEGORY  1: Negative. Electronically Signed: By: Margarette Canada M.D. On: 03/16/2017 13:49   MM 3D SCREEN BREAST BILATERAL  Result Date: 01/01/2021 CLINICAL DATA:  Screening. EXAM: DIGITAL SCREENING BILATERAL MAMMOGRAM WITH TOMOSYNTHESIS AND CAD TECHNIQUE: Bilateral screening digital craniocaudal and mediolateral oblique mammograms were obtained. Bilateral screening digital breast tomosynthesis was performed. The images were evaluated with computer-aided detection. COMPARISON:  Previous exam(s). ACR Breast Density Category d: The breast tissue is extremely dense, which lowers the sensitivity of mammography.  FINDINGS: In the right breast, a possible mass-like asymmetry visible only on the MLO view warrants further evaluation. In the left breast,  no findings suspicious for malignancy. IMPRESSION: Further evaluation is suggested for a possible asymmetry in the right breast. RECOMMENDATION: Diagnostic mammogram and possibly ultrasound of the right breast. (Code:[id number]) The patient will be contacted regarding the findings, and additional imaging will be scheduled. BI-RADS CATEGORY  0: Incomplete. Need additional imaging evaluation and/or prior mammograms for comparison. Electronically Signed   By: Evangeline Dakin M.D.   On: 01/01/2021 07:42        Pelvic/Bimanual Pap is not indicated today per BCCCP guidelines.   Smoking History: Patient has never smoked.   Patient Navigation: Patient education provided. Access to services provided for patient through [facility] program. Spanish interpreter Rudene Anda from [facility] provided.   Colorectal Cancer Screening: Per patient has never had colonoscopy completed. FIT Test given to patient to complete. No complaints today.    Breast and Cervical Cancer Risk Assessment: Patient does not have family history of breast cancer, known genetic mutations, or radiation treatment to the chest before age 2. Patient does not have history of cervical dysplasia, immunocompromised, or DES exposure in-utero.  Risk Assessment     Risk Scores       01/07/2021   Last edited by: Demetrius Revel, LPN   5-year risk: 0.4 %   Lifetime risk: 4.9 %            A: BCCCP exam without pap smear No complaints.  P: Referred patient to the Center for a right breast diagnostic mammogram per recommendation. Appointment scheduled Thursday, January 07, 2021 at [place].  Loletta Parish, RN 01/07/2021 8:47 AM

## 2021-01-07 NOTE — Patient Instructions (Signed)
Explained breast self awareness Danielle Campbell. Patient did not need a Pap smear today due to last Pap smear and HPV typing was 12/15/2020. Let her know BCCCP will cover Pap smears and HPV typing every 5 years unless has a history of abnormal Pap smears. Referred patient to the [facility] for a right breast diagnostic mammogram per recommendation. Appointment scheduled Thursday, January 07, 2021 at [place]. Patient aware of appointment and will be there. [name] verbalized understanding.  Emeli Goguen, [name], RN 8:47 AM

## 2021-01-19 ENCOUNTER — Ambulatory Visit
Admission: RE | Admit: 2021-01-19 | Discharge: 2021-01-19 | Disposition: A | Discharge status: Home / Self Care | Payer: No Typology Code available for payment source | Source: Ambulatory Visit | Attending: Obstetrics and Gynecology | Admitting: Obstetrics and Gynecology

## 2021-01-19 ENCOUNTER — Other Ambulatory Visit: Payer: Self-pay

## 2021-01-19 DIAGNOSIS — R928 Other abnormal and inconclusive findings on diagnostic imaging of breast: Secondary | ICD-10-CM

## 2021-01-21 LAB — FECAL OCCULT BLOOD, IMMUNOCHEMICAL: Fecal Occult Bld: NEGATIVE

## 2021-01-27 ENCOUNTER — Telehealth: Payer: Self-pay

## 2021-01-27 NOTE — Telephone Encounter (Signed)
Via Lavon Paganini, Spanish Interpreter [facility]), Patient informed negative FIT test results, no blood in stool. Patient verbalized understanding.

## 2021-02-19 ENCOUNTER — Ambulatory Visit (INDEPENDENT_AMBULATORY_CARE_PROVIDER_SITE_OTHER): Payer: Self-pay | Admitting: Obstetrics and Gynecology

## 2021-02-19 ENCOUNTER — Other Ambulatory Visit: Payer: Self-pay

## 2021-02-19 ENCOUNTER — Encounter: Payer: Self-pay | Admitting: Obstetrics and Gynecology

## 2021-02-19 VITALS — BP 122/79 | HR 86 | Ht 64.0 in | Wt 182.0 lb

## 2021-02-19 DIAGNOSIS — N8110 Cystocele, unspecified: Secondary | ICD-10-CM

## 2021-02-19 DIAGNOSIS — N3941 Urge incontinence: Secondary | ICD-10-CM

## 2021-02-19 DIAGNOSIS — R350 Frequency of micturition: Secondary | ICD-10-CM

## 2021-02-19 DIAGNOSIS — N393 Stress incontinence (female) (male): Secondary | ICD-10-CM

## 2021-02-19 DIAGNOSIS — N812 Incomplete uterovaginal prolapse: Secondary | ICD-10-CM

## 2021-02-19 LAB — POCT URINALYSIS DIPSTICK
Appearance: NORMAL
Bilirubin, UA: NEGATIVE
Glucose, UA: NEGATIVE
Ketones, UA: NEGATIVE
Leukocytes, UA: NEGATIVE
Nitrite, UA: NEGATIVE
Protein, UA: NEGATIVE
Spec Grav, UA: 1.025 (ref 1.010–1.025)
Urobilinogen, UA: 0.2 E.U./dL
pH, UA: 5.5 (ref 5.0–8.0)

## 2021-02-19 MED ORDER — OXYBUTYNIN CHLORIDE ER 5 MG PO TB24
5.0000 mg | ORAL_TABLET | Freq: Every day | ORAL | 5 refills | Status: DC
Start: 2021-02-19 — End: 2021-09-24

## 2021-02-19 NOTE — Progress Notes (Signed)
[facility] New Patient Evaluation and Consultation  Referring Provider: Scot Jun, FNP PCP: Patient, No Pcp Per (Inactive) Date of Service: 02/19/2021  SUBJECTIVE Chief Complaint: New Patient (Initial Visit) (Referral for prolapse)  History of Present Illness: SMT LOKEY is a 45 y.o. Hispanic female seen in consultation at the request of FNP Kenton Kingfisher for evaluation of prolapse.    Review of records from health dept significant for: Has uterine prolapse that is bothersome. Can feel something coming out of vagina. Also having abnormal uterine bleeding and was considering IUD placement. Had [place] which showed uterus was 10 x8 x7 cm with 1.5cm endometrial stripe without masses noted.   Urinary Symptoms: Leaks urine with cough/ sneeze, laughing, with a full bladder, with movement to the bathroom, and with urgency Leaks 5 time(s) per day.  Pad use: liners/ mini-pads per day.   She is bothered by her UI symptoms. No prior treatment for leakage  Day time voids 8.  Nocturia: 2 times per night to void. Voiding dysfunction: she empties her bladder well.  does not use a catheter to empty bladder.  When urinating, she feels she has no difficulties Drinks: 1 cup coffee, water all day, 2 sodas per week   UTIs:  0  UTI's in the last year.   Denies history of blood in urine and kidney or bladder stones  Pelvic Organ Prolapse Symptoms:                  She Admits to a feeling of a bulge the vaginal area. It has been present for 3 years.  She Admits to seeing a bulge.  This bulge is bothersome.  Bowel Symptom: Bowel movements: 3 time(s) per day Stool consistency: soft  Straining: no.  Splinting: yes.  Incomplete evacuation: no.  She Denies accidental bowel leakage / fecal incontinence Bowel regimen: none   Sexual Function Sexually active: yes.  Pain with sex: Yes, at the vaginal opening, has discomfort due to prolapse  Pelvic Pain Admits to pelvic  pain Location: lower left and right Pain occurs: when she gets up fast Improved by: resting    Past Medical History:  Past Medical History:  Diagnosis Date   GERD (gastroesophageal reflux disease)    Tuberculosis    Uterus prolapse      Past Surgical History:   Past Surgical History:  Procedure Laterality Date   CHOLECYSTECTOMY N/A 10/21/2013   Procedure: LAPAROSCOPIC CHOLECYSTECTOMY WITH INTRAOPERATIVE CHOLANGIOGRAM;  Surgeon: Ralene Ok, MD;  Location: [place];  Service: General;  Laterality: N/A;   LAPAROSCOPIC CHOLECYSTECTOMY  10/21/2013   w/IOC     Past OB/GYN History: OB History  Gravida Para Term Preterm AB Living  4       1 3   SAB IAB Ectopic Multiple Live Births  1            # Outcome Date GA Lbr Len/2nd Weight Sex Delivery Anes PTL Lv  4 Gravida           3 Gravida           2 Gravida           1 SAB             Vaginal deliveries: 3,  Forceps/ Vacuum deliveries: 0, Cesarean section: 0 Patient's last menstrual period was 12/15/2020 (exact date).  Contraception: none Last pap smear was sept 2022- neg.  Any history of abnormal pap smears: no.   Medications: She has a current  medication list which includes the following prescription(s): oxybutynin and ferrous sulfate.   Allergies: Patient is allergic to hydrocodone.   Social History:  Social History   Tobacco Use   Smoking status: Never   Smokeless tobacco: Never  Vaping Use   Vaping Use: Never used  Substance Use Topics   Alcohol use: No   Drug use: No    Relationship status: single She lives with partner.   She is not employed. Regular exercise: No History of abuse: No  Family History:   Family History  Problem Relation Age of Onset   Hypercholesterolemia Mother    Breast cancer Neg Hx      Review of Systems: Review of Systems  Constitutional:  Negative for fever, malaise/fatigue and weight loss.  Respiratory:  Negative for cough, shortness of breath and wheezing.    Cardiovascular:  Negative for chest pain, palpitations and leg swelling.  Gastrointestinal:  Negative for abdominal pain and blood in stool.  Genitourinary:  Negative for dysuria.  Musculoskeletal:  Negative for myalgias.  Skin:  Negative for rash.  Neurological:  Negative for dizziness and headaches.  Endo/Heme/Allergies:  Does not bruise/bleed easily.       + hot flashes  Psychiatric/Behavioral:  Negative for depression. The patient is not nervous/anxious.     OBJECTIVE Physical Exam: Vitals:   02/19/21 0826  BP: 122/79  Pulse: 86  Weight: 182 lb (82.6 kg)  Height: 5\' 4"  (1.626 m)    Physical Exam Constitutional:      General: She is not in acute distress. Pulmonary:     Effort: Pulmonary effort is normal.  Abdominal:     General: There is no distension.     Palpations: Abdomen is soft.     Tenderness: There is no abdominal tenderness. There is no rebound.  Musculoskeletal:        General: No swelling. Normal range of motion.  Skin:    General: Skin is warm and dry.     Findings: No rash.  Neurological:     Mental Status: She is alert and oriented to person, place, and time.  Psychiatric:        Mood and Affect: Mood normal.        Behavior: Behavior normal.     GU / Detailed Urogynecologic Evaluation:  Pelvic Exam: Normal external female genitalia; Bartholin's and Skene's glands normal in appearance; urethral meatus normal in appearance, no urethral masses or discharge.   CST: negative  Speculum exam reveals normal vaginal mucosa without atrophy. Cervix normal appearance. Uterus normal single, nontender. Adnexa no mass, fullness, tenderness.    Pelvic floor strength II/V  Pelvic floor musculature: Right levator non-tender, Right obturator non-tender, Left levator non-tender, Left obturator non-tender  POP-Q:   POP-Q  1                                            Aa   1                                           Ba  3  C   6                                            Gh  3                                            Pb  8                                            tvl   -2                                            Ap  -2                                            Bp  -5                                              D     Rectal Exam:  Normal external rectum  Post-Void Residual (PVR) by Bladder Scan: In order to evaluate bladder emptying, we discussed obtaining a postvoid residual and she agreed to this procedure.  Procedure: The ultrasound unit was placed on the patient's abdomen in the suprapubic region after the patient had voided. A PVR of 7 ml was obtained by bladder scan.  Laboratory Results: POC urine: trace blood   ASSESSMENT AND PLAN Ms. Whitmire is a 45 y.o. with:  1. Uterovaginal prolapse, incomplete   2. Prolapse of anterior vaginal wall   3. Urge incontinence   4. Urinary frequency   5. SUI (stress urinary incontinence, female)    Stage II anterior, Stage I posterior, Stage III apical prolapse - For treatment of pelvic organ prolapse, we discussed options for management including expectant management, conservative management, and surgical management, such as Kegels, a pessary, pelvic floor physical therapy, and specific surgical procedures. - She is interested in surgery. We discussed two options for prolapse repair:  1) vaginal repair without mesh - Pros - safer, no mesh complications - Cons - not as strong as mesh repair, higher risk of recurrence  2) laparoscopic repair with mesh - Pros - stronger, better long-term success - Cons - risks of mesh implant (erosion into vagina or bladder, adhering to the rectum, pain) - these risks are lower than with a vaginal mesh but still exist - Handouts provided on both options in spanish. She would like to proceed with surgery but wants to read about options further.   2. Urgency/ frequency -We discussed the  symptoms of overactive bladder (OAB), which include urinary urgency, urinary frequency, nocturia, with or without urge incontinence.  While we do not know the exact etiology of OAB, several treatment options exist. We discussed management including behavioral therapy (decreasing bladder irritants, urge suppression strategies, timed  voids, bladder retraining), physical therapy, medication.  - She is interested in starting medication. She does not have insurance. Oxybutynin 5mg  XL daily ordered because this is the least expensive option with [facility].   3. SUI - For treatment of stress urinary incontinence,  non-surgical options include expectant management, weight loss, physical therapy, as well as a pessary.  Surgical options include a midurethral sling, Burch urethropexy, and transurethral injection of a bulking agent. - She will proceed with urodynamic testing. We discussed we could do additional procedure for leakage if needed.   Return for urodynamic testing.    Jaquita Folds, MD

## 2021-03-01 ENCOUNTER — Emergency Department (HOSPITAL_COMMUNITY)
Admission: EM | Admit: 2021-03-01 | Discharge: 2021-03-02 | Disposition: A | Discharge status: Home / Self Care | Payer: No Typology Code available for payment source | Attending: Emergency Medicine | Admitting: Emergency Medicine

## 2021-03-01 ENCOUNTER — Other Ambulatory Visit: Payer: Self-pay

## 2021-03-01 ENCOUNTER — Encounter (HOSPITAL_COMMUNITY): Payer: Self-pay | Admitting: Emergency Medicine

## 2021-03-01 DIAGNOSIS — Z20822 Contact with and (suspected) exposure to covid-19: Secondary | ICD-10-CM | POA: Insufficient documentation

## 2021-03-01 DIAGNOSIS — B349 Viral infection, unspecified: Secondary | ICD-10-CM | POA: Insufficient documentation

## 2021-03-01 NOTE — ED Provider Notes (Signed)
Emergency Medicine Provider Triage Evaluation Note  Danielle Campbell , a 45 y.o. female  was evaluated in triage.  Pt complains of shortness of breath, body aches, fever, cough X 4 days.  Son and husband at home with the flu.  Review of Systems  Positive: Fever, cough Negative: Chest  Physical Exam  BP 134/88 (BP Location: Right Arm)   Pulse 73   Temp 98.4 F (36.9 C) (Oral)   Resp 18   SpO2 99%  Gen:   Awake, no distress   Resp:  Normal effort  MSK:   Moves extremities without difficulty  Other:  Lungs clear to auscultation, no tachycardia  Medical Decision Making  Medically screening exam initiated at 5:07 PM.  Appropriate orders placed.  Danielle Campbell was informed that the remainder of the evaluation will be completed by another provider, this initial triage assessment does not replace that evaluation, and the importance of remaining in the ED until their evaluation is complete.  Flu and COVID test.   Sherrill Raring, PA-C 03/01/21 1709    Lacretia Leigh, MD 03/02/21 [phone]

## 2021-03-01 NOTE — ED Triage Notes (Signed)
Pt c/o fever, dizziness and shortness of breath x 4 days. Reports both husband and daughter flu+.

## 2021-03-02 LAB — RESP PANEL BY RT-PCR (FLU A&B, COVID) ARPGX2
Influenza A by PCR: POSITIVE — AB
Influenza B by PCR: NEGATIVE
SARS Coronavirus 2 by RT PCR: NEGATIVE

## 2021-03-02 MED ORDER — ONDANSETRON 4 MG PO TBDP: 4.0000 mg | ORAL_TABLET | Freq: Three times a day (TID) | ORAL | 0 refills | Status: DC | PRN

## 2021-03-02 NOTE — ED Provider Notes (Signed)
[facility] Emergency Department Provider Note MRN:  [id number]  Arrival date & time: 03/02/21     Chief Complaint   Fever   History of Present Illness   Danielle Campbell is a 45 y.o. year-old female with a history of GERD presenting to the ED with chief complaint of fever.  Cough, fever, body aches, malaise for the past 4 days.  Multiple family members at home with the flu.  Here today because having some nausea today and was interested in some nausea medicine.  No abdominal pain, no chest pain or shortness of breath.  Symptoms mild to moderate, constant, no exacerbating or alleviating factors.  Review of Systems  A complete 10 system review of systems was obtained and all systems are negative except as noted in the HPI and PMH.   Patient's Health History    Past Medical History:  Diagnosis Date   GERD (gastroesophageal reflux disease)    Tuberculosis    Uterus prolapse     Past Surgical History:  Procedure Laterality Date   CHOLECYSTECTOMY N/A 10/21/2013   Procedure: LAPAROSCOPIC CHOLECYSTECTOMY WITH INTRAOPERATIVE CHOLANGIOGRAM;  Surgeon: Ralene Ok, MD;  Location: [place];  Service: General;  Laterality: N/A;   LAPAROSCOPIC CHOLECYSTECTOMY  10/21/2013   w/IOC    Family History  Problem Relation Age of Onset   Hypercholesterolemia Mother    Breast cancer Neg Hx     Social History   Socioeconomic History   Marital status: Single    Spouse name: Not on file   Number of children: 3   Years of education: Not on file   Highest education level: 9th grade  Occupational History   Not on file  Tobacco Use   Smoking status: Never   Smokeless tobacco: Never  Vaping Use   Vaping Use: Never used  Substance and Sexual Activity   Alcohol use: No   Drug use: No   Sexual activity: Yes    Birth control/protection: None  Other Topics Concern   Not on file  Social History Narrative   Not on file   Social Determinants of Health    Financial Resource Strain: Not on file  Food Insecurity: No Food Insecurity   Worried About Running Out of Food in the Last Year: Never true   Blades in the Last Year: Never true  Transportation Needs: No Transportation Needs   Lack of Transportation (Medical): No   Lack of Transportation (Non-Medical): No  Physical Activity: Not on file  Stress: Not on file  Social Connections: Not on file  Intimate Partner Violence: Not on file     Physical Exam   Vitals:   03/01/21 1939 03/01/21 2352  BP: 129/80 133/75  Pulse: 75 82  Resp: 16 17  Temp: 98.2 F (36.8 C) 98.9 F (37.2 C)  SpO2: 100% 98%    CONSTITUTIONAL: Well-appearing, NAD NEURO:  Alert and oriented x 3, no focal deficits EYES:  eyes equal and reactive ENT/NECK:  no LAD, no JVD CARDIO: Regular rate, well-perfused, normal S1 and S2 PULM:  CTAB no wheezing or rhonchi GI/GU:  normal bowel sounds, non-distended, non-tender MSK/SPINE:  No gross deformities, no edema SKIN:  no rash, atraumatic PSYCH:  Appropriate speech and behavior  *Additional and/or pertinent findings included in MDM below  Diagnostic and Interventional Summary    EKG Interpretation  Date/Time:    Ventricular Rate:    PR Interval:    QRS Duration:   QT Interval:  QTC Calculation:   R Axis:     Text Interpretation:         Labs Reviewed  RESP PANEL BY RT-PCR (FLU A&B, COVID) [id number]    No orders to display    Medications - No data to display   Procedures  /  Critical Care Procedures  ED Course and Medical Decision Making  I have reviewed the triage vital signs, the nursing notes, and pertinent available records from the EMR.  Listed above are laboratory and imaging tests that I personally ordered, reviewed, and interpreted and then considered in my medical decision making (see below for details).  Suspect viral illness, especially influenza given sick contacts at home.  No increased work of breathing, normal vital  signs, lungs clear, appropriate for discharge with symptomatic management.       Barth Kirks. Sedonia Small, [place] [email or web address]  Final Clinical Impressions(s) / ED Diagnoses     ICD-10-CM   1. Viral illness  B34.9       ED Discharge Orders          Ordered    ondansetron (ZOFRAN-ODT) 4 MG disintegrating tablet  Every 8 hours PRN        03/02/21 0223             Discharge Instructions Discussed with and Provided to Patient:    Discharge Instructions      You were evaluated in the Emergency Department and after careful evaluation, we did not find any emergent condition requiring admission or further testing in the hospital.  Your exam/testing today is overall reassuring.  Symptoms likely due to a viral illness such as the flu.  Recommend following up on your flu swab on MyChart.  Recommend Tylenol or Motrin at home for pain.  Plenty of fluids and rest.  Can use the Zofran medication as needed for nausea.  Please return to the Emergency Department if you experience any worsening of your condition.   Thank you for allowing [place] to be a part of your care.       Maudie Flakes, MD 03/02/21 Reece Agar

## 2021-03-02 NOTE — Discharge Instructions (Signed)
You were evaluated in the Emergency Department and after careful evaluation, we did not find any emergent condition requiring admission or further testing in the hospital.  Your exam/testing today is overall reassuring.  Symptoms likely due to a viral illness such as the flu.  Recommend following up on your flu swab on MyChart.  Recommend Tylenol or Motrin at home for pain.  Plenty of fluids and rest.  Can use the Zofran medication as needed for nausea.  Please return to the Emergency Department if you experience any worsening of your condition.   Thank you for allowing [place] to be a part of your care.

## 2021-03-02 NOTE — ED Notes (Signed)
Discharge instructions discussed with pt and son. Pt and son verbalized understanding. Pt stable and ambulatory. No signature pad available

## 2021-06-23 ENCOUNTER — Other Ambulatory Visit: Payer: Self-pay

## 2021-06-23 ENCOUNTER — Ambulatory Visit (INDEPENDENT_AMBULATORY_CARE_PROVIDER_SITE_OTHER): Payer: Self-pay | Admitting: Obstetrics and Gynecology

## 2021-06-23 VITALS — BP 138/84 | HR 81 | Ht 64.0 in | Wt 182.0 lb

## 2021-06-23 DIAGNOSIS — R350 Frequency of micturition: Secondary | ICD-10-CM

## 2021-06-23 DIAGNOSIS — N393 Stress incontinence (female) (male): Secondary | ICD-10-CM

## 2021-06-23 LAB — POCT URINALYSIS DIPSTICK
Appearance: NORMAL
Bilirubin, UA: NEGATIVE
Blood, UA: NEGATIVE
Glucose, UA: NEGATIVE
Ketones, UA: NEGATIVE
Leukocytes, UA: NEGATIVE
Nitrite, UA: NEGATIVE
Protein, UA: NEGATIVE
Spec Grav, UA: 1.025 (ref 1.010–1.025)
Urobilinogen, UA: 0.2 E.U./dL
pH, UA: 6 (ref 5.0–8.0)

## 2021-06-23 NOTE — Patient Instructions (Signed)

## 2021-06-29 NOTE — Progress Notes (Signed)
[facility] ?Urodynamics Procedure ? ?Referring Physician: No ref. provider found ?Date of Procedure: 06/23/2021 ? ?Danielle Campbell is a 46 y.o. female who presents for urodynamic evaluation. Indication(s) for study: mixed incontinence ? ?Vital Signs: ?BP 138/84   Pulse 81   Ht '5\' 4"'$  (1.626 m)   Wt 182 lb (82.6 kg)   LMP 05/05/2021   BMI 31.24 kg/m?  ? ?Laboratory Results: ?A catheterized urine specimen revealed: ? ?POC urine: negative ? ? ?Voiding Diary: ?Not perfomed ? ?Procedure Timeout: ? The correct patient was verified and the correct procedure was verified. The patient was in the correct position and safety precautions were reviewed based on at the patient's history. ? ?Urodynamic Procedure ?A 55F dual lumen urodynamics catheter was placed under sterile conditions into the patient's bladder. A 55F catheter was placed into the rectum in order to measure abdominal pressure. EMG patches were placed in the appropriate position.  All connections were confirmed and calibrations/adjusted made. Saline was instilled into the bladder through the dual lumen catheters.  Cough/valsalva pressures were measured periodically during filling.  Patient was allowed to void.  The bladder was then emptied of its residual. ? ?UROFLOW: ?Revealed a Qmax of 17 mL/sec.  She voided 66 mL and had a residual of 10 mL.  It was a normal pattern and represented normal habits though interpretation limited due to low voided volume. ? ?CMG: ?This was performed with sterile water in the sitting position at a fill rate of 20- 30 mL/min.   ? ?First sensation of fullness was 141 mLs,  ?First urge was 272 mLs,  ?Strong urge was 559 mLs and  ?Capacity was 645 mLs ? ?Stress incontinence was demonstrated ? ?Lowest positive Barrier CLPP was 108 cmH20 at 594 ml. ?Highest negative Barrier VLPP was 47 cmH20 at 200 ml. ? ?Detrusor function was normal, with no phasic contractions seen.   ? ?Compliance:  normal. End fill detrusor  pressure was 5.4cmH20.  Calculated compliance was 131m/cmH20 ? ?UPP: ?MUCP could not be calculated due to catheter error ? ? ?MICTURITION STUDY: ?Voiding was performed with reduction using scopettes in the sitting position.  Pdet at Qmax was 48 cm of water.  Qmax was 14.3 mL/sec.  It was a normal pattern.  She voided 645 mL and had a residual of 0 mL.  It was a volitional void, sustained detrusor contraction was present and abdominal straining was not present ? ?EMG: ?This was performed with patches.  She had voluntary contractions, recruitment with fill was present and urethral sphincter was intermittently relaxed with void. ? ?The details of the procedure with the study tracings have been scanned into EPIC.  ? ?Urodynamic Impression:  ?1. Sensation was reduced; capacity was increased ?2. Stress Incontinence was demonstrated at normal pressures; ?3. Detrusor Overactivity was not demonstrated ?4. Emptying was normal with a normal PVR, a sustained detrusor contraction present,  abdominal straining not present, normal urethral sphincter activity on EMG. ? ?Plan: ?- The patient will follow up  to discuss the findings and treatment options.  ? ?

## 2021-07-07 ENCOUNTER — Encounter: Payer: Self-pay | Admitting: Obstetrics and Gynecology

## 2021-07-07 ENCOUNTER — Ambulatory Visit (INDEPENDENT_AMBULATORY_CARE_PROVIDER_SITE_OTHER): Payer: Self-pay | Admitting: Obstetrics and Gynecology

## 2021-07-07 VITALS — BP 132/85 | HR 93

## 2021-07-07 DIAGNOSIS — N812 Incomplete uterovaginal prolapse: Secondary | ICD-10-CM

## 2021-07-07 DIAGNOSIS — N8110 Cystocele, unspecified: Secondary | ICD-10-CM

## 2021-07-07 NOTE — Patient Instructions (Addendum)
Instrucciones generales ? ? La recuperaci?n (sin reposo en cama) durar? aproximadamente 6 semanas ? ? Se anima a caminar, pero abstenerse de hacer ejercicio extenuante/tareas dom?sticas/levantamiento de objetos pesados. ? ?o No levantar >10 libras ? ? Nada en la vagina: NO relaciones sexuales, tampones ni duchas vaginales ? ? Ba?o: No se sumerja en agua (NO nadar, ba?arse, jacuzzi, etc.) hasta despu?s de su visita posoperatoria. Puede ducharse a partir del d?a posterior a la cirug?a. ? ? No conduzca hasta que no est? tomando analg?sicos narc?ticos y [employer] su dolor est? lo suficientemente bien controlado como para poder pisar los frenos o hacer movimientos bruscos si es necesario. ? ?Tomando sus medicamentos ? ?1. Tome su paracetamol e ibuprofeno seg?n un horario durante las primeras 48 horas. Tome 600 mg de ibuprofeno, luego tome 500 mg de acetaminofeno 3 horas m?s tarde, luego contin?e alternando ibuprofeno y acetaminofeno. As? vas tomando cada tipo de medicamento cada 6 horas. ? ?2. Tomar el estupefaciente prescrito (oxicodona, tramadol, etc) seg?n necesidad, siendo como m?ximo cada 4 horas. ? ?3. Tome un ablandador de heces todos los d?as para Theatre manager las heces blandas y Product/process development scientist que se esfuerce. Si tiene diarrea, disminuye su ablandador de heces. Esto se explica m?s abajo. Le hemos recetado Miralax. ? ?Razones para llamar a la enfermera (consulte la ?ltima p?gina para ver los n?meros de tel?fono) ? ? Sangrado abundante (cambiar su toalla sanitaria cada 1-2 horas) ? ? N?useas/v?mitos persistentes ? ? Fiebre (100.4 grados o m?s) ? ? Problemas de la incisi?n (salida de pus u otro l?quido, enrojecimiento, calor, aumento del dolor) ? ?Cosas que esperar despu?s de la cirug?a ? ? El dolor de leve a moderado es normal durante el primer o segundo d?a despu?s de la cirug?a. Si se lo recetan, tome ibuprofeno o Tylenol primero y use el medicamento m?s fuerte para el dolor ?irruptivo?. Puede superponer estos medicamentos  porque funcionan de [employer]. ? ? Constipaci?n ? ? Para prevenir el estre?imiento: Consuma una dieta bien balanceada que incluya prote?nas, granos, frutas y verduras frescas. Beber mucho l?quido. [place] regularmente. Dependiendo de las instrucciones espec?ficas de su m?dico: tome Miralax diariamente y, adem?s, puede agregar un ablandador de heces (colace/docusate) y un suplemento de [place]. Contin?e mientras tome medicamentos para Conservation officer, historic buildings. ? ? Para tratar el estre?imiento: si no tiene una evacuaci?n intestinal en 2 d?as despu?s de la cirug?a, puede tomar 2 cucharadas de leche de magnesia 1-2 veces al d?a hasta que tenga una evacuaci?n intestinal. Si se presenta diarrea, disminuya la cantidad o suspenda el laxante. Si no obtiene [employer] de magnesia, puede beber una botella de citrato de magnesio que puede comprar sin Statistician. ? ? Fatiga: Esta es una respuesta normal a la cirug?a y Teacher, English as a foreign language? con Physiological scientist. Planifique per?odos de descanso frecuentes a lo largo del d?a. ? ? Dolor por gases: ?Esto es muy com?n pero tambi?n puede ser muy doloroso! Beba l?quidos tibios como infusiones, caldos o sopas. Caminar te ayudar? a expulsar m?s gases. Mylicon o Gas-X se pueden tomar sin receta. ? ? P?rdida de orina: Pueden ocurrir cantidades variables de p?rdida despu?s de la cirug?a. Esto deber?a mejorar con [employer]. Su vejiga necesita al menos 3 meses para recuperarse de la cirug?a. Si tiene fugas despu?s de la cirug?a, aseg?rese de mencion?rselo a su m?dico en su visita posoperatoria. Si estaba tomando medicamentos para la vejiga hiperactiva antes de la cirug?a, aseg?rese de reiniciarlos inmediatamente despu?s de la cirug?a. ? ? Incisiones: si tiene incisiones en el  abdomen, el pegamento para la piel se disolver? por s? solo con Physiological scientist. Est? bien enjuagar suavemente con agua y jab?n sobre estas incisiones, pero no frote. ? ?Cat?ter ? ?Aproximadamente el 50% de los pacientes no pueden orinar despu?s de  la cirug?a y necesitan irse a casa con un cat?ter. Esto permite que su vejiga descanse para que pueda volver a funcionar por completo. Si se va a casa con un cat?ter, el consultorio lo llamar? para programar una prueba de evacuaci?n unos d?as despu?s de la cirug?a. Para la mayor?a de los [place], en esta visita, pueden orinar por s? mismos. [place] de cat?teres a largo plazo es raro. ? ?Volver al [place] ? ?[place] y los tiempos de recuperaci?n var?an ampliamente, es dif?cil predecir cu?ndo querr? volver a Fish farm manager. Si tiene un trabajo de escritorio sin actividad f?sica extenuante y desea regresar antes de lo que generalmente se recomienda, hable con su proveedor o llame a nuestra oficina. ? ?Preocupaciones posteriores a la operaci?n ? ?Para problemas que no sean de emergencia, llame a la enfermera de uroginecolog?a. Deje un mensaje y alguien se comunicar? con usted dentro de un d?a h?bil. Tambi?n puede enviar un mensaje a trav?s de MyChart. ? ?[place] ([place] 5:00 p. m. y los fines de semana): Para asuntos urgentes que no pueden esperar hasta el siguiente d?a h?bil. Llame a Zigmund Daniel al [phone] y con?ctese con el m?dico de [place]. ? ?Por favor reserve esto para asuntos importantes. ? ?**PARA CUALQUIER PROBLEMA DE EMERGENCIA VERDADERA, LLAME AL 911 O VAYA A LA SALA DE EMERGENCIAS M?S CERCANA.** Informe a nuestra oficina o al m?dico de guardia sobre Pharmacist, hospital. ? ?CITAS: Llame al [phone] ? ?

## 2021-07-07 NOTE — Progress Notes (Signed)
Danielle Campbell ?Return Visit ? ?SUBJECTIVE  ?History of Present Illness: ?Danielle Campbell is a 46 y.o. female seen in follow-up after urodynamic testing to discuss surgery.  ? ?Urodynamic Impression:  ?1. Sensation was reduced; capacity was increased ?2. Stress Incontinence was demonstrated at normal pressures; ?3. Detrusor Overactivity was not demonstrated ?4. Emptying was normal with a normal PVR, a sustained detrusor contraction present,  abdominal straining not present, normal urethral sphincter activity on EMG. ? ?Currently on oxybutynin '5mg'$  XL for urge incontinence symptoms.  ? ?Past Medical History: ?Patient  has a past medical history of GERD (gastroesophageal reflux disease), Tuberculosis, and Uterus prolapse.  ? ?Past Surgical History: ?She  has a past surgical history that includes Laparoscopic cholecystectomy (10/21/2013) and Cholecystectomy (N/A, 10/21/2013).  ? ?Medications: ?She has a current medication list which includes the following prescription(s): oxybutynin.  ? ?Allergies: ?Patient is allergic to hydrocodone.  ? ?Social History: ?Patient  reports that she has never smoked. She has never used smokeless tobacco. She reports that she does not drink alcohol and does not use drugs.  ?  ?  ?OBJECTIVE  ?  ? ?Physical Exam: ?Vitals:  ? 07/07/21 1301  ?BP: 132/85  ?Pulse: 93  ? ?Gen: No apparent distress, A&O x 3. ? ?Detailed Urogynecologic Evaluation:  ?Deferred. Prior exam showed: ? ?POP-Q:  ?  ?POP-Q ?  ?1  ?                                          Aa   ?1 ?                                          Ba   ?3  ?                                            C  ?  ?6  ?                                          Gh   ?3  ?                                          Pb   ?8  ?                                          tvl  ?  ?-2  ?                                          Ap   ?-2  ?  Bp   ?-5  ?                                            D  ?    ? ?ASSESSMENT AND PLAN  ?  ?Ms. Kama is a 46 y.o. with:  ?No diagnosis found. ? ?Plan for surgery: Exam under anesthesia, robotic assisted total laparoscopic hysterectomy, bilateral salpingectomy, sacrocolpopexy, midurethral sling and cystoscopy.  ? ?- Visit carried out with [place] interpreter.  ?- We reviewed the patient's specific anatomic and functional findings, with the assistance of diagrams, and together finalized the above procedure. The planned surgical procedures were discussed along with the surgical risks outlined below. Additional treatment options including expectant management, conservative management, medical management were discussed where appropriate.  We reviewed the benefits and risks of each treatment option.  ? ?General Surgical Risks: ?For all procedures, there are risks of bleeding, infection, damage to surrounding organs including but not limited to bowel, bladder, blood vessels, ureters and nerves, and need for further surgery if an injury were to occur. These risks are all low with minimally invasive surgery.  ? ?There are risks of numbness and weakness at any body site or buttock/rectal pain.  It is possible that baseline pain can be worsened by surgery, either with or without mesh. If surgery is vaginal, there is also a low risk of possible conversion to laparoscopy or open abdominal incision where indicated. Very rare risks include blood transfusion, blood clot, heart attack, pneumonia, or death.  ? ?There is also a risk of short-term postoperative urinary retention with need to use a catheter. About half of patients need to go home from surgery with a catheter, which is then later removed in the office. The risk of long-term need for a catheter is very low. There is also a risk of worsening of overactive bladder.  ? ?Sling: ?The effectiveness of a midurethral vaginal mesh sling is approximately 85%, and thus, there will be times when you may leak urine after surgery,  especially if your bladder is full or if you have a strong cough. There is a balance between making the sling tight enough to treat your leakage but not too tight so that you have long-term difficulty emptying your bladder. A mesh sling will not directly treat overactive bladder/urge incontinence and may worsen it.  There is an FDA safety notification on vaginal mesh procedures for prolapse but NOT mesh slings. We have extensive experience and training with mesh placement and we have close postoperative follow up to identify any potential complications from mesh. It is important to realize that this mesh is a permanent implant that cannot be easily removed. There are rare risks of mesh exposure (2-4%), pain with intercourse (0-7%), and infection (<1%). The risk of mesh exposure if more likely in a woman with risks for poor healing (prior radiation, poorly controlled diabetes, or immunocompromised). The risk of new or worsened chronic pain after mesh implant is more common in women with baseline chronic pain and/or poorly controlled anxiety or depression. Approximately 2-4% of patients will experience longer-term post-operative voiding dysfunction that may require surgical revision of the sling. We also reviewed that postoperatively, her stream may not be as strong as before surgery.  ? ?Prolapse (with or without mesh): ?Risk factors for surgical failure  include things that put pressure on your pelvis and the surgical repair, including  obesity, chronic cough, and heavy lifting or straining (including lifting children or adults, straining on the toilet, or lifting heavy objects such as furniture or anything weighing >25 lbs. Risks of recurrence is 20-30% with vaginal native tissue repair and a less than 10% with sacrocolpopexy with mesh.   ? ?Sacrocolpopexy: ?Mesh implants may provide more prolapse support, but do have some unique risks to consider. It is important to understand that mesh is permanent and cannot be  easily removed. Risks of abdominal sacrocolpopexy mesh include mesh exposure (~3-6%), painful intercourse (recent studies show lower rates after surgery compared to before, with ~5-8% risk of new onset), and very rare risks of bowel or bladder injury or infection (<1%). The risk of mesh exposure is more likely in a woman with risks for poor healing (prior radiation, poorly controlled diabetes, or immunocompromised). The risk of new or worsened chronic pain after mesh implant is more common in women with baseline chronic pain and/or poorly controlled anxiety or depression. There is an FDA safety notification on vaginal mesh procedures for prolapse but NOT abdominal mesh procedures and therefore does not apply to your surgery. We have extensive experience and training with mesh placement and we have close postoperative follow up to identify any potential complications from mesh.   ? ?- For preop Visit:  She is required to have a visit within 30 days of her surgery.  ? ?Today we reviewed pre-operative preparation, peri-operative expectations, and post-operative instructions/recovery.  She was provided with instructional handouts. She understands not to take aspirin (>'81mg'$ ) or NSAIDs 7 days prior to surgery. Prescriptions will be provided for: Tramadol '50mg'$ , Ibuprofen '600mg'$ , Tylenol '500mg'$ , Miralax. These prescriptions will be sent prior to surgery. ? ?- Medical clearance: not required  ?- Anticoagulant use: No ?- Medicaid Hysterectomy form: No ?- Accepts blood transfusion: Yes ?- Expected length of stay: outpatient ? ?Request sent for surgery scheduling.  ? ?Jaquita Folds, MD ? ? ?

## 2021-07-20 ENCOUNTER — Telehealth: Payer: Self-pay | Admitting: Obstetrics and Gynecology

## 2021-08-09 NOTE — Telephone Encounter (Signed)
noted 

## 2021-09-24 ENCOUNTER — Ambulatory Visit (INDEPENDENT_AMBULATORY_CARE_PROVIDER_SITE_OTHER): Payer: Self-pay | Admitting: Obstetrics and Gynecology

## 2021-09-24 ENCOUNTER — Encounter: Payer: Self-pay | Admitting: Obstetrics and Gynecology

## 2021-09-24 VITALS — BP 142/85 | HR 97

## 2021-09-24 DIAGNOSIS — Z01818 Encounter for other preprocedural examination: Secondary | ICD-10-CM

## 2021-09-24 DIAGNOSIS — N393 Stress incontinence (female) (male): Secondary | ICD-10-CM

## 2021-09-24 DIAGNOSIS — N8110 Cystocele, unspecified: Secondary | ICD-10-CM

## 2021-09-24 DIAGNOSIS — N812 Incomplete uterovaginal prolapse: Secondary | ICD-10-CM

## 2021-09-24 MED ORDER — ACETAMINOPHEN 500 MG PO TABS: 500.0000 mg | ORAL_TABLET | Freq: Four times a day (QID) | ORAL | 0 refills | Status: DC | PRN

## 2021-09-24 MED ORDER — POLYETHYLENE GLYCOL 3350 17 GM/SCOOP PO POWD: 17.0000 g | Freq: Every day | ORAL | 0 refills | Status: DC

## 2021-09-24 MED ORDER — TRAMADOL HCL 50 MG PO TABS: 50.0000 mg | ORAL_TABLET | Freq: Three times a day (TID) | ORAL | 0 refills | Status: AC | PRN

## 2021-09-24 MED ORDER — IBUPROFEN 600 MG PO TABS: 600.0000 mg | ORAL_TABLET | Freq: Four times a day (QID) | ORAL | 0 refills | Status: DC | PRN

## 2021-10-07 ENCOUNTER — Other Ambulatory Visit: Payer: Self-pay

## 2021-10-07 ENCOUNTER — Encounter (HOSPITAL_BASED_OUTPATIENT_CLINIC_OR_DEPARTMENT_OTHER): Payer: Self-pay | Admitting: Obstetrics and Gynecology

## 2021-10-07 NOTE — Progress Notes (Signed)
Your procedure is scheduled on Wednesday, 10/20/21.  Report to [facility].   Call this number if you have problems the morning of surgery  :[phone].   OUR ADDRESS IS [place].  WE ARE LOCATED IN [facility].  PLEASE BRING YOUR INSURANCE CARD AND PHOTO ID DAY OF SURGERY.  ONLY 2 PEOPLE ARE ALLOWED IN  WAITING  ROOM.                                      REMEMBER:  DO NOT EAT FOOD, CANDY GUM OR MINTS  AFTER MIDNIGHT THE NIGHT BEFORE YOUR SURGERY . YOU MAY HAVE CLEAR LIQUIDS FROM MIDNIGHT THE NIGHT BEFORE YOUR SURGERY UNTIL  9:00 AM. NO CLEAR LIQUIDS AFTER   9:00 AM DAY OF SURGERY.  YOU MAY  BRUSH YOUR TEETH MORNING OF SURGERY AND RINSE YOUR MOUTH OUT, NO CHEWING GUM CANDY OR MINTS.     CLEAR LIQUID DIET   Foods Allowed                                                                     Foods Excluded  Coffee and tea, regular and decaf                             liquids that you cannot  Plain Jell-O                                                                   see through such as: Fruit ices (not with fruit pulp)                                     milk, soups, orange juice  Plain  Popsicles                                    All solid food Carbonated beverages, regular and diet                                    Cranberry, grape and apple juices Sports drinks like Gatorade _____________________________________________________________________     TAKE THESE MEDICATIONS MORNING OF SURGERY: Do not take any medications or vitamins on the morning of surgery.    UP TO 4 VISITORS  MAY VISIT IN THE EXTENDED RECOVERY ROOM UNTIL 800 PM ONLY.  ONE  VISITOR AGE 70 AND OVER MAY SPEND THE NIGHT AND MUST BE IN EXTENDED RECOVERY ROOM NO LATER THAN 800 PM . YOUR DISCHARGE TIME AFTER YOU SPEND THE NIGHT IS 900 AM THE MORNING AFTER YOUR SURGERY.  YOU MAY PACK  A SMALL OVERNIGHT BAG WITH TOILETRIES FOR YOUR OVERNIGHT STAY IF  YOU WISH.  YOUR PRESCRIPTION MEDICATIONS WILL BE PROVIDED DURING [place].                                      DO NOT WEAR JEWERLY, MAKE UP. DO NOT WEAR LOTIONS, POWDERS, PERFUMES OR NAIL POLISH ON YOUR FINGERNAILS. TOENAIL POLISH IS OK TO WEAR. DO NOT SHAVE FOR 48 HOURS PRIOR TO DAY OF SURGERY. MEN MAY SHAVE FACE AND NECK. CONTACTS, GLASSES, OR DENTURES MAY NOT BE WORN TO SURGERY.  REMEMBER: NO SMOKING, DRUGS OR ALCOHOL FOR 24 HOURS BEFORE YOUR SURGERY.                                    [name] IS NOT RESPONSIBLE  FOR ANY BELONGINGS.                                                                    Danielle Campbell           [facility] - Preparing for Surgery Before surgery, you can play an important role.  Because skin is not sterile, your skin needs to be as free of germs as possible.  You can reduce the number of germs on your skin by washing with CHG (chlorahexidine gluconate) soap before surgery.  CHG is an antiseptic cleaner which kills germs and bonds with the skin to continue killing germs even after washing. Please DO NOT use if you have an allergy to CHG or antibacterial soaps.  If your skin becomes reddened/irritated stop using the CHG and inform your nurse when you arrive at Short Stay. Do not shave (including legs and underarms) for at least 48 hours prior to the first CHG shower.  You may shave your face/neck. Please follow these instructions carefully:  1.  Shower with CHG Soap the night before surgery and the  morning of Surgery.  2.  If you choose to wash your hair, wash your hair first as usual with your  normal  shampoo.  3.  After you shampoo, rinse your hair and body thoroughly to remove the  shampoo.                            4.  Use CHG as you would any other liquid soap.  You can apply chg directly  to the skin and wash , please wash your belly button thoroughly with chg soap provided night before and morning of your surgery.                     Gently with a  scrungie or clean washcloth.  5.  Apply the CHG Soap to your body ONLY FROM THE NECK DOWN.   Do not use on face/ open                           Wound or open sores. Avoid contact with eyes, ears mouth and genitals (private parts).  Wash face,  Genitals (private parts) with your normal soap.             6.  Wash thoroughly, paying special attention to the area where your surgery  will be performed.  7.  Thoroughly rinse your body with warm water from the neck down.  8.  DO NOT shower/wash with your normal soap after using and rinsing off  the CHG Soap.                9.  Pat yourself dry with a clean towel.            10.  Wear clean pajamas.            11.  Place clean sheets on your bed the night of your first shower and do not  sleep with pets. Day of Surgery : Do not apply any lotions/deodorants the morning of surgery.  Please wear clean clothes to the hospital/surgery center.  IF YOU HAVE ANY SKIN IRRITATION OR PROBLEMS WITH THE SURGICAL SOAP, PLEASE GET A BAR OF GOLD DIAL SOAP AND SHOWER THE NIGHT BEFORE YOUR SURGERY AND THE MORNING OF YOUR SURGERY. PLEASE LET THE NURSE KNOW MORNING OF YOUR SURGERY IF YOU HAD ANY PROBLEMS WITH THE SURGICAL SOAP.   ________________________________________________________________________                                                        QUESTIONS [facility] PRE OP NURSE PHONE [phone].

## 2021-10-07 NOTE — Progress Notes (Signed)
Spoke w/ via phone for pre-op interview---Caely[place] via [place] interpreter from [employer]---- urine pregnancy             Lab results------10/15/21 lab appt for cbc, type & screen COVID test -----patient states asymptomatic no test needed Arrive at -------1000 on Wednesday, 10/20/21 NPO after MN NO Solid Food.  Clear liquids from MN until---0900 Med rec completed Medications to take morning of surgery -----none Diabetic medication -----n/a Patient instructed no nail polish to be worn day of surgery Patient instructed to bring photo id and insurance card day of surgery Patient aware to have Driver (ride ) / caregiver    for 24 hours after surgery - boyfriend, Sixto Patient Special Instructions -----Extended stay / Overnight instructions given. Pre-Op special Istructions -----Female Spanish interpreter requested for day of surgery. Patient verbalized understanding of instructions that were given at this phone interview. Patient denies shortness of breath, chest pain, fever, cough at this phone interview.   Patient stated that when she picked up her instructions for surgery at her PAT appointment, she had someone that could read her the instructions.

## 2021-10-10 ENCOUNTER — Encounter (HOSPITAL_COMMUNITY): Payer: Self-pay

## 2021-10-10 ENCOUNTER — Other Ambulatory Visit: Payer: Self-pay

## 2021-10-10 ENCOUNTER — Emergency Department (HOSPITAL_COMMUNITY)
Admission: EM | Admit: 2021-10-10 | Discharge: 2021-10-11 | Disposition: A | Discharge status: Home / Self Care | Payer: No Typology Code available for payment source | Attending: Emergency Medicine | Admitting: Emergency Medicine

## 2021-10-10 DIAGNOSIS — N3001 Acute cystitis with hematuria: Secondary | ICD-10-CM

## 2021-10-10 DIAGNOSIS — D259 Leiomyoma of uterus, unspecified: Secondary | ICD-10-CM

## 2021-10-10 DIAGNOSIS — R102 Pelvic and perineal pain: Secondary | ICD-10-CM

## 2021-10-10 LAB — CBC WITH DIFFERENTIAL/PLATELET
Abs Immature Granulocytes: 0.08 10*3/uL — ABNORMAL HIGH (ref 0.00–0.07)
Basophils Absolute: 0.1 10*3/uL (ref 0.0–0.1)
Basophils Relative: 0 %
Eosinophils Absolute: 0.3 10*3/uL (ref 0.0–0.5)
Eosinophils Relative: 2 %
HCT: 32.7 % — ABNORMAL LOW (ref 36.0–46.0)
Hemoglobin: 10.7 g/dL — ABNORMAL LOW (ref 12.0–15.0)
Immature Granulocytes: 0 %
Lymphocytes Relative: 9 %
Lymphs Abs: 1.6 10*3/uL (ref 0.7–4.0)
MCH: 27.6 pg (ref 26.0–34.0)
MCHC: 32.7 g/dL (ref 30.0–36.0)
MCV: 84.5 fL (ref 80.0–100.0)
Monocytes Absolute: 0.9 10*3/uL (ref 0.1–1.0)
Monocytes Relative: 5 %
Neutro Abs: 15.1 10*3/uL — ABNORMAL HIGH (ref 1.7–7.7)
Neutrophils Relative %: 84 %
Platelets: 417 10*3/uL — ABNORMAL HIGH (ref 150–400)
RBC: 3.87 MIL/uL (ref 3.87–5.11)
RDW: 13.2 % (ref 11.5–15.5)
WBC: 18 10*3/uL — ABNORMAL HIGH (ref 4.0–10.5)
nRBC: 0 % (ref 0.0–0.2)

## 2021-10-10 LAB — URINALYSIS, ROUTINE W REFLEX MICROSCOPIC
Bilirubin Urine: NEGATIVE
Glucose, UA: NEGATIVE mg/dL
Ketones, ur: NEGATIVE mg/dL
Nitrite: NEGATIVE
Protein, ur: NEGATIVE mg/dL
Specific Gravity, Urine: 1.001 — ABNORMAL LOW (ref 1.005–1.030)
pH: 7 (ref 5.0–8.0)

## 2021-10-10 LAB — I-STAT BETA HCG BLOOD, ED (MC, WL, AP ONLY): I-stat hCG, quantitative: <5 m[IU]/mL (ref <5)

## 2021-10-10 LAB — COMPREHENSIVE METABOLIC PANEL
ALT: 23 U/L (ref 0–44)
AST: 24 U/L (ref 15–41)
Albumin: 3.8 g/dL (ref 3.5–5.0)
Alkaline Phosphatase: 128 U/L — ABNORMAL HIGH (ref 38–126)
Anion gap: 13 (ref 5–15)
BUN: 8 mg/dL (ref 6–20)
CO2: 19 mmol/L — ABNORMAL LOW (ref 22–32)
Calcium: 9.2 mg/dL (ref 8.9–10.3)
Chloride: 104 mmol/L (ref 98–111)
Creatinine, Ser: 0.71 mg/dL (ref 0.44–1.00)
GFR, Estimated: >60 mL/min (ref >60)
Glucose, Bld: 163 mg/dL — ABNORMAL HIGH (ref 70–99)
Potassium: 3.7 mmol/L (ref 3.5–5.1)
Sodium: 136 mmol/L (ref 135–145)
Total Bilirubin: 0.8 mg/dL (ref 0.3–1.2)
Total Protein: 7.9 g/dL (ref 6.5–8.1)

## 2021-10-10 LAB — LIPASE, BLOOD: Lipase: 31 U/L (ref 11–51)

## 2021-10-10 NOTE — ED Provider Triage Note (Signed)
Emergency Medicine Provider Triage Evaluation Note  Danielle Campbell , a 46 y.o. female  was evaluated in triage.  Pt complains of right lower quadrant abdominal pain.  She had onset of her.  2 days ago.  Started having severe constant right lower quadrant abdominal pain now radiating to her back and upper thigh.  Pain is severe.  Patient denies urinary symptoms.  Review of Systems  Positive: Lower quadrant abdominal pain Negative: Fever  Physical Exam  BP (!) 141/76   Pulse 99   Temp 99 F (37.2 C) (Oral)   Resp 14   LMP 09/12/2021 (Approximate)   SpO2 98%  Gen:   Awake, no distress   Resp:  Normal effort  MSK:   Moves extremities without difficulty  Other:  Tenderness to palpation right lower quadrant  Medical Decision Making  Medically screening exam initiated at 5:21 PM.  Appropriate orders placed.  [name] was informed that the remainder of the evaluation will be completed by another provider, this initial triage assessment does not replace that evaluation, and the importance of remaining in the ED until their evaluation is complete.  Work-up initiated   Margarita Mail, PA-C 10/10/21 1742

## 2021-10-10 NOTE — ED Triage Notes (Signed)
Patient scheduled for hysterectomy and for the past 9 days RLQ pain and unable to sleep. Reports started with her cycle and pain radiating down leg.

## 2021-10-11 ENCOUNTER — Emergency Department (HOSPITAL_COMMUNITY): Payer: No Typology Code available for payment source

## 2021-10-11 MED ORDER — NAPROXEN 500 MG PO TABS
500.0000 mg | ORAL_TABLET | Freq: Two times a day (BID) | ORAL | 0 refills | Status: DC
Start: 2021-10-11 — End: 2023-08-02

## 2021-10-11 MED ORDER — IOHEXOL 300 MG/ML  SOLN
75.0000 mL | Freq: Once | INTRAMUSCULAR | Status: AC | PRN
  Administered 2021-10-11: 75 mL via INTRAVENOUS

## 2021-10-11 MED ORDER — CEPHALEXIN 500 MG PO CAPS
500.0000 mg | ORAL_CAPSULE | Freq: Four times a day (QID) | ORAL | 0 refills | Status: AC
Start: 2021-10-11 — End: 2021-10-18

## 2021-10-11 NOTE — Discharge Instructions (Addendum)
Your work-up today showed uterine fibroids on CT scan as well as a pelvic ultrasound.  You did not want to have the transvaginal ultrasound.  This limited the study and we were unable to fully evaluate for ovarian torsion.  You did had improvement in pain after taking Tylenol.  Urine showed some concern for UTI.  I have given you prescriptions for antibiotics as well as naproxen to help with pain if it is coming from your uterine fibroids.  Please call and follow-up with your gynecologist.  If you have any worsening symptoms please return to the emergency room.

## 2021-10-11 NOTE — ED Provider Notes (Signed)
[facility] EMERGENCY DEPARTMENT Provider Note   CSN: [id number] Arrival date & time: 10/10/21  1648     History  No chief complaint on file.   Danielle Campbell is a 46 y.o. female.  46 year old female presents today for evaluation of right lower quadrant abdominal pain.  Reports sudden onset yesterday.  Does report improvement following taking Tylenol.  Denies fever, chills, dysuria, vaginal discharge.  Currently is on her menstrual cycle and reports regular menstrual bleeding.  Endorses nausea but denies vomiting.  Denies changes to her stool habits.  Is scheduled for hysterectomy for 7/19.  The history is provided by the patient. No language interpreter was used.       Home Medications Prior to Admission medications   Medication Sig Start Date End Date Taking? Authorizing Provider  acetaminophen (TYLENOL) 500 MG tablet Take 1 tablet (500 mg total) by mouth every 6 (six) hours as needed (pain). 09/24/21   Jaquita Folds, MD  ibuprofen (ADVIL) 600 MG tablet Take 1 tablet (600 mg total) by mouth every 6 (six) hours as needed. 09/24/21   Jaquita Folds, MD  MACA ROOT PO Take by mouth daily.    [provider]  Magnesium Citrate 100 MG TABS Take by mouth every evening.    [provider]  Multiple Vitamins-Minerals (WOMENS MULTIVITAMIN PO) Take by mouth.    [provider]  polyethylene glycol powder (GLYCOLAX/MIRALAX) 17 GM/SCOOP powder Take 17 g by mouth daily. Drink 17g (1 scoop) dissolved in water per day. 09/24/21   Jaquita Folds, MD  Potassium 95 MG TABS Take by mouth.    [provider]  VALERIAN ROOT PO Take by mouth every evening.    [provider]  vitamin B-12 (CYANOCOBALAMIN) 1000 MCG tablet Take 1,000 mcg by mouth daily.    [provider]      Allergies    Hydrocodone    Review of Systems   Review of Systems  Constitutional:  Negative for chills and fever.   Gastrointestinal:  Positive for abdominal pain and nausea. Negative for vomiting.  Genitourinary:  Positive for pelvic pain and vaginal bleeding. Negative for difficulty urinating, dysuria, flank pain and vaginal discharge.  Neurological:  Negative for light-headedness.  All other systems reviewed and are negative.   Physical Exam Updated Vital Signs BP 106/61   Pulse 77   Temp 98.2 F (36.8 C) (Oral)   Resp 18   LMP 09/12/2021 (Approximate)   SpO2 97%  Physical Exam Vitals and nursing note reviewed. Exam conducted with a chaperone present.  Constitutional:      General: She is not in acute distress.    Appearance: Normal appearance. She is not ill-appearing.  HENT:     Head: Normocephalic and atraumatic.     Nose: Nose normal.  Eyes:     General: No scleral icterus.    Extraocular Movements: Extraocular movements intact.     Conjunctiva/sclera: Conjunctivae normal.  Cardiovascular:     Rate and Rhythm: Normal rate and regular rhythm.     Pulses: Normal pulses.  Pulmonary:     Effort: Pulmonary effort is normal. No respiratory distress.     Breath sounds: Normal breath sounds. No wheezing or rales.  Abdominal:     General: There is no distension.     Palpations: Abdomen is soft.     Tenderness: There is abdominal tenderness (Tenderness of right lower quadrant, left lower quadrant, epigastric region.). There is no right CVA  tenderness, left CVA tenderness or guarding.  Musculoskeletal:        General: Normal range of motion.     Cervical back: Normal range of motion.  Skin:    General: Skin is warm and dry.  Neurological:     General: No focal deficit present.     Mental Status: She is alert. Mental status is at baseline.     ED Results / Procedures / Treatments   Labs (all labs ordered are listed, but only abnormal results are displayed) Labs Reviewed  CBC WITH DIFFERENTIAL/PLATELET - Abnormal; Notable for the following components:      Result Value   WBC 18.0  (*)    Hemoglobin 10.7 (*)    HCT 32.7 (*)    Platelets 417 (*)    Neutro Abs 15.1 (*)    Abs Immature Granulocytes 0.08 (*)    All other components within normal limits  COMPREHENSIVE METABOLIC PANEL - Abnormal; Notable for the following components:   CO2 19 (*)    Glucose, Bld 163 (*)    Alkaline Phosphatase 128 (*)    All other components within normal limits  URINALYSIS, ROUTINE W REFLEX MICROSCOPIC - Abnormal; Notable for the following components:   Color, Urine STRAW (*)    APPearance HAZY (*)    Specific Gravity, Urine 1.001 (*)    Hgb urine dipstick LARGE (*)    Leukocytes,Ua MODERATE (*)    Bacteria, UA RARE (*)    All other components within normal limits  LIPASE, BLOOD  I-STAT BETA HCG BLOOD, ED ([facility], [facility], [facility] ONLY)    EKG None  Radiology CT Abdomen Pelvis W Contrast  Result Date: 10/11/2021 CLINICAL DATA:  Right lower quadrant pain EXAM: CT ABDOMEN AND PELVIS WITH CONTRAST TECHNIQUE: Multidetector CT imaging of the abdomen and pelvis was performed using the standard protocol following bolus administration of intravenous contrast. RADIATION DOSE REDUCTION: This exam was performed according to the departmental dose-optimization program which includes automated exposure control, adjustment of the mA and/or kV according to patient size and/or use of iterative reconstruction technique. CONTRAST:  64m OMNIPAQUE IOHEXOL 300 MG/ML  SOLN COMPARISON:  None Available. FINDINGS: Lower chest: Mild atelectatic changes are noted in the bases. No acute infiltrate is seen. Hepatobiliary: No focal liver abnormality is seen. Status post cholecystectomy. No biliary dilatation. Pancreas: Unremarkable. No pancreatic ductal dilatation or surrounding inflammatory changes. Spleen: Normal in size without focal abnormality. Adrenals/Urinary Tract: Adrenal glands are within normal limits. Kidneys are well visualized bilaterally. No renal calculi or obstructive changes are seen. The ureters are within  normal limits. Bladder is decompressed. Stomach/Bowel: Very mild diverticular change of the colon is noted without evidence of diverticulitis. The appendix is within normal limits. Small bowel shows no obstructive change. The stomach is within normal limits. Vascular/Lymphatic: No significant vascular findings are present. No enlarged abdominal or pelvic lymph nodes. Reproductive: Uterus is heterogeneous and prominent likely related to fibroid change. Other: No abdominal wall hernia or abnormality. No abdominopelvic ascites. Musculoskeletal: No acute or significant osseous findings. IMPRESSION: Mild diverticular change. Findings suspicious for uterine fibroids. No other focal abnormality is noted. Electronically Signed   By: MInez CatalinaM.D.   On: 10/11/2021 02:23    Procedures Procedures    Medications Ordered in ED Medications  iohexol (OMNIPAQUE) 300 MG/ML solution 75 mL (75 mLs Intravenous Contrast Given 10/11/21 0219)    ED Course/ Medical Decision Making/ A&P Clinical Course as of 10/11/21 0618  Mon Oct 11, 2021  0536 Patient refuses transvaginal ultrasound due to discomfort given she is on her menstrual cycle.  Discussed importance of transvaginal ultrasound to rule out ovarian torsion.  She voices understanding and still defers.  Pelvic ultrasound pending.  Would like to proceed with pelvic exam. [AA]  0555 Pelvic exam without evidence of inflamed cervix, vaginal discharge.  Tenderness present throughout the pelvic exam likely due to her being on menstrual cycle.  [AA]    Clinical Course User Index [AA] Evlyn Courier, PA-C                           Medical Decision Making Amount and/or Complexity of Data Reviewed Labs: ordered. Radiology: ordered.   Medical Decision Making / ED Course   This patient presents to the ED for concern of pelvic pain, this involves an extensive number of treatment options, and is a complaint that carries with it a high risk of complications and  morbidity.  The differential diagnosis includes ovarian torsion, PID, uterine fibroids, appendicitis, pyelonephritis, UTI, STI  MDM: 46 year old female presents today for evaluation of right lower quadrant abdominal pain.  Onset yesterday.  Reports similar pain in the past but just not this severe.  Improves with Tylenol.  Without fever, chills, dysuria, vaginal discharge.  Currently is on her menstrual cycle.  She is scheduled for hysterectomy for 7/19.  Work-up shows leukocytosis of 18,000 without significant left shift, UA shows signs of UTI.  hCG within normal, lipase within normal, CMP with elevated alk phos otherwise without acute findings.  CT abdomen pelvis without acute intra-abdominal or pelvic concerns.  Does mentioned uterine fibroids.  Ultrasound pelvis again mentions uterine fibroids otherwise without acute findings.  Patient refused transvaginal ultrasound due to discomfort while being on menstrual cycle.  Pelvic exam does not show any evidence of PID.  We will treat patient for UTI.  Strict return precautions discussed.  Discussed following up with her gynecologist.  Discussed we are unable to rule out ovarian torsion given she was not able to comply with transvaginal ultrasound.  She understands this limitation and is in agreement with plan.  Discussed calling her OB/GYN today to schedule follow-up.  Naproxen and antibiotics to cover UTI provided.  Urine culture sent.   Lab Tests: -I ordered, reviewed, and interpreted labs.   The pertinent results include:   Labs Reviewed  CBC WITH DIFFERENTIAL/PLATELET - Abnormal; Notable for the following components:      Result Value   WBC 18.0 (*)    Hemoglobin 10.7 (*)    HCT 32.7 (*)    Platelets 417 (*)    Neutro Abs 15.1 (*)    Abs Immature Granulocytes 0.08 (*)    All other components within normal limits  COMPREHENSIVE METABOLIC PANEL - Abnormal; Notable for the following components:   CO2 19 (*)    Glucose, Bld 163 (*)    Alkaline  Phosphatase 128 (*)    All other components within normal limits  URINALYSIS, ROUTINE W REFLEX MICROSCOPIC - Abnormal; Notable for the following components:   Color, Urine STRAW (*)    APPearance HAZY (*)    Specific Gravity, Urine 1.001 (*)    Hgb urine dipstick LARGE (*)    Leukocytes,Ua MODERATE (*)    Bacteria, UA RARE (*)    All other components within normal limits  URINE CULTURE  LIPASE, BLOOD  I-STAT BETA HCG BLOOD, ED ([facility], [facility], [facility] ONLY)      EKG  EKG Interpretation  Date/Time:    Ventricular Rate:    PR Interval:    QRS Duration:   QT Interval:    QTC Calculation:   R Axis:     Text Interpretation:           Imaging Studies ordered: I ordered imaging studies including pelvic ultrasound, CT abdomen pelvis with contrast.  Refused transvaginal ultrasound. I independently visualized and interpreted imaging. I agree with the radiologist interpretation   Medicines ordered and prescription drug management: Meds ordered this encounter  Medications   iohexol (OMNIPAQUE) 300 MG/ML solution 75 mL    -I have reviewed the patients home medicines and have made adjustments as needed  Cardiac Monitoring: The patient was maintained on a cardiac monitor.  I personally viewed and interpreted the cardiac monitored which showed an underlying rhythm of: Normal sinus rhythm  Reevaluation: After the interventions noted above, I reevaluated the patient and found that they have :improved  Co morbidities that complicate the patient evaluation  Past Medical History:  Diagnosis Date   Dysmenorrhea 2018   GERD (gastroesophageal reflux disease)    Positive TB test    normal chest x-ray on 06/19/2020, as of 10/07/21, pt states she has never been sick with TB or had any symptoms   Uterus prolapse    Wears glasses       Dispostion: Patient is appropriate for discharge.  Discharged in stable condition.  Return precautions discussed.  Patient voices understanding and is in  agreement with plan.   Final Clinical Impression(s) / ED Diagnoses Final diagnoses:  Uterine leiomyoma, unspecified location  Acute cystitis with hematuria  Pelvic pain    Rx / DC Orders ED Discharge Orders          Ordered    cephALEXin (KEFLEX) 500 MG capsule  4 times daily        10/11/21 0618    naproxen (NAPROSYN) 500 MG tablet  2 times daily        10/11/21 0618              Evlyn Courier, PA-C 90/93/11 2162    Delora Fuel, MD 44/69/50 [phone]

## 2021-10-12 LAB — URINE CULTURE: Culture: NO GROWTH

## 2021-10-15 ENCOUNTER — Encounter (HOSPITAL_COMMUNITY)
Admission: RE | Admit: 2021-10-15 | Discharge: 2021-10-15 | Disposition: A | Discharge status: Home / Self Care | Payer: No Typology Code available for payment source | Source: Ambulatory Visit | Attending: Obstetrics and Gynecology | Admitting: Obstetrics and Gynecology

## 2021-10-15 DIAGNOSIS — N812 Incomplete uterovaginal prolapse: Secondary | ICD-10-CM

## 2021-10-15 DIAGNOSIS — Z01812 Encounter for preprocedural laboratory examination: Secondary | ICD-10-CM | POA: Insufficient documentation

## 2021-10-15 LAB — CBC
HCT: 29.9 % — ABNORMAL LOW (ref 36.0–46.0)
Hemoglobin: 10 g/dL — ABNORMAL LOW (ref 12.0–15.0)
MCH: 30.3 pg (ref 26.0–34.0)
MCHC: 33.4 g/dL (ref 30.0–36.0)
MCV: 90.6 fL (ref 80.0–100.0)
Platelets: 457 10*3/uL — ABNORMAL HIGH (ref 150–400)
RBC: 3.3 MIL/uL — ABNORMAL LOW (ref 3.87–5.11)
RDW: 15.8 % — ABNORMAL HIGH (ref 11.5–15.5)
WBC: 8.1 10*3/uL (ref 4.0–10.5)
nRBC: 0 % (ref 0.0–0.2)

## 2021-10-15 NOTE — Patient Instructions (Addendum)
Instrucciones:  Su cirugia esta programada para-( your procedure is scheduled on) : October 20, 2021  Entre por la entrada principal a la(s) -(enter through the main entrance at):10:00AM  Por favor llame al [phone] si tiene algun problema la [place] ( please call [phone] if you have any problems the morning of surgery.)  Recuerde: (Remember)  No coma alimentos ni tome liquidos, incluyendo agua, despues de la medianoche  ( Do not eat food or drink liquids including water after midnight   [employer] la manana de la cirugia con un sorbito de agua (take these meds the morning of surgery with a SIP of water)None  [employer] dientes en la manana de la [place]. (you may brush your teeth the morning of surgery)  NO use joyas, maquillaje de ojos, lapiz labial, crema para el cuerpo o esmalte de unas oscuro - las unas de los pies pueden estar pintados. ( Do not wear jewelry, eye makeup, lipstick, body lotion, or dark fingernail polish)  Puede usar desodorante ( you may wear deodorant)  Si va a ser ingresado despues de las [place], deje la [place] en el carro hasta que se le haya asignado una habitacion. ( If you are to be admitted after surgery, leave suitcase in car until your room has been assigned.)  A los pacientes que se les de de alta el mismo dia no se les permitira manejar a casa.  ( Patients discharged on the day of surgery will not be allowed to drive home)  Use ropa suelta y comoda de regreso a Manufacturing engineer. ( wear loose comfortable clothes for ride home)  [place] (patient signature) ______________________________________

## 2021-10-20 ENCOUNTER — Ambulatory Visit (HOSPITAL_COMMUNITY): Payer: No Typology Code available for payment source

## 2021-10-20 ENCOUNTER — Other Ambulatory Visit: Payer: Self-pay

## 2021-10-20 ENCOUNTER — Encounter (HOSPITAL_BASED_OUTPATIENT_CLINIC_OR_DEPARTMENT_OTHER): Payer: Self-pay | Admitting: Obstetrics and Gynecology

## 2021-10-20 ENCOUNTER — Encounter (HOSPITAL_BASED_OUTPATIENT_CLINIC_OR_DEPARTMENT_OTHER)
Admission: RE | Disposition: A | Discharge status: Home / Self Care | Payer: Self-pay | Source: Home / Self Care | Attending: Obstetrics and Gynecology

## 2021-10-20 ENCOUNTER — Ambulatory Visit (HOSPITAL_BASED_OUTPATIENT_CLINIC_OR_DEPARTMENT_OTHER): Payer: No Typology Code available for payment source | Admitting: Anesthesiology

## 2021-10-20 ENCOUNTER — Ambulatory Visit (HOSPITAL_BASED_OUTPATIENT_CLINIC_OR_DEPARTMENT_OTHER)
Admission: RE | Admit: 2021-10-20 | Discharge: 2021-10-21 | Disposition: A | Discharge status: Home / Self Care | Payer: No Typology Code available for payment source | Attending: Obstetrics and Gynecology | Admitting: Obstetrics and Gynecology

## 2021-10-20 DIAGNOSIS — N812 Incomplete uterovaginal prolapse: Secondary | ICD-10-CM

## 2021-10-20 DIAGNOSIS — Z01818 Encounter for other preprocedural examination: Secondary | ICD-10-CM

## 2021-10-20 DIAGNOSIS — N8301 Follicular cyst of right ovary: Secondary | ICD-10-CM | POA: Insufficient documentation

## 2021-10-20 DIAGNOSIS — N393 Stress incontinence (female) (male): Secondary | ICD-10-CM

## 2021-10-20 DIAGNOSIS — N8003 Adenomyosis of the uterus: Secondary | ICD-10-CM | POA: Insufficient documentation

## 2021-10-20 DIAGNOSIS — N838 Other noninflammatory disorders of ovary, fallopian tube and broad ligament: Secondary | ICD-10-CM | POA: Insufficient documentation

## 2021-10-20 DIAGNOSIS — N888 Other specified noninflammatory disorders of cervix uteri: Secondary | ICD-10-CM | POA: Insufficient documentation

## 2021-10-20 HISTORY — DX: Presence of spectacles and contact lenses: Z97.3

## 2021-10-20 HISTORY — PX: XI ROBOTIC ASSISTED TOTAL HYSTERECTOMY WITH SACROCOLPOPEXY: SHX6825

## 2021-10-20 HISTORY — PX: BLADDER SUSPENSION: SHX72

## 2021-10-20 HISTORY — PX: CYSTOSCOPY: SHX5120

## 2021-10-20 HISTORY — DX: Nonspecific reaction to tuberculin skin test without active tuberculosis: R76.11

## 2021-10-20 LAB — POCT PREGNANCY, URINE: Preg Test, Ur: NEGATIVE

## 2021-10-20 LAB — TYPE AND SCREEN
ABO/RH(D): O POS
Antibody Screen: NEGATIVE

## 2021-10-20 LAB — ABO/RH: ABO/RH(D): O POS

## 2021-10-20 SURGERY — HYSTERECTOMY, TOTAL, ROBOT-ASSISTED, WITH SACROCOLPOPEXY
Anesthesia: General | Site: Vagina

## 2021-10-20 MED ORDER — AMISULPRIDE (ANTIEMETIC) 5 MG/2ML IV SOLN: 10.0000 mg | Freq: Once | INTRAVENOUS | Status: DC | PRN

## 2021-10-20 MED ORDER — ROCURONIUM BROMIDE 10 MG/ML (PF) SYRINGE
PREFILLED_SYRINGE | INTRAVENOUS | Status: AC
  Filled 2021-10-20: qty 10

## 2021-10-20 MED ORDER — POVIDONE-IODINE 10 % EX SWAB: 2.0000 | Freq: Once | CUTANEOUS | Status: DC

## 2021-10-20 MED ORDER — DEXAMETHASONE SODIUM PHOSPHATE 10 MG/ML IJ SOLN
INTRAMUSCULAR | Status: DC | PRN
  Administered 2021-10-20: 10 mg via INTRAVENOUS

## 2021-10-20 MED ORDER — WATER FOR IRRIGATION, STERILE IR SOLN
Status: DC | PRN
  Administered 2021-10-20: 500 mL

## 2021-10-20 MED ORDER — EPHEDRINE 5 MG/ML INJ
INTRAVENOUS | Status: AC
  Filled 2021-10-20: qty 5

## 2021-10-20 MED ORDER — GABAPENTIN 300 MG PO CAPS
ORAL_CAPSULE | ORAL | Status: AC
  Filled 2021-10-20: qty 1

## 2021-10-20 MED ORDER — GABAPENTIN 300 MG PO CAPS
300.0000 mg | ORAL_CAPSULE | ORAL | Status: AC
  Administered 2021-10-20: 300 mg via ORAL

## 2021-10-20 MED ORDER — DOCUSATE SODIUM 100 MG PO CAPS
ORAL_CAPSULE | ORAL | Status: AC
  Filled 2021-10-20: qty 1

## 2021-10-20 MED ORDER — SODIUM CHLORIDE 0.9 % IR SOLN
Status: DC | PRN
  Administered 2021-10-20 (×2): 1000 mL

## 2021-10-20 MED ORDER — DOCUSATE SODIUM 100 MG PO CAPS
100.0000 mg | ORAL_CAPSULE | Freq: Two times a day (BID) | ORAL | Status: DC
Start: 2021-10-20 — End: 2021-10-21
  Administered 2021-10-20: 100 mg via ORAL

## 2021-10-20 MED ORDER — ONDANSETRON HCL 4 MG PO TABS: 4.0000 mg | ORAL_TABLET | Freq: Four times a day (QID) | ORAL | Status: DC | PRN

## 2021-10-20 MED ORDER — ACETAMINOPHEN 500 MG PO TABS
1000.0000 mg | ORAL_TABLET | Freq: Once | ORAL | Status: AC
  Administered 2021-10-20: 1000 mg via ORAL

## 2021-10-20 MED ORDER — CELECOXIB 200 MG PO CAPS
400.0000 mg | ORAL_CAPSULE | ORAL | Status: AC
  Administered 2021-10-20: 400 mg via ORAL

## 2021-10-20 MED ORDER — DEXAMETHASONE SODIUM PHOSPHATE 10 MG/ML IJ SOLN
INTRAMUSCULAR | Status: AC
  Filled 2021-10-20: qty 1

## 2021-10-20 MED ORDER — OXYCODONE HCL 5 MG/5ML PO SOLN: 5.0000 mg | Freq: Once | ORAL | Status: AC | PRN

## 2021-10-20 MED ORDER — BUPIVACAINE HCL (PF) 0.25 % IJ SOLN
INTRAMUSCULAR | Status: DC | PRN
  Administered 2021-10-20: 20 mL

## 2021-10-20 MED ORDER — FENTANYL CITRATE (PF) 100 MCG/2ML IJ SOLN
25.0000 ug | INTRAMUSCULAR | Status: DC | PRN
  Administered 2021-10-20 – 2021-10-21 (×4): 25 ug via INTRAVENOUS

## 2021-10-20 MED ORDER — ONDANSETRON HCL 4 MG/2ML IJ SOLN
INTRAMUSCULAR | Status: AC
  Filled 2021-10-20: qty 2

## 2021-10-20 MED ORDER — ACETAMINOPHEN 325 MG PO TABS
650.0000 mg | ORAL_TABLET | ORAL | Status: DC | PRN
  Administered 2021-10-21 (×2): 650 mg via ORAL

## 2021-10-20 MED ORDER — HEMOSTATIC AGENTS (NO CHARGE) OPTIME
TOPICAL | Status: DC | PRN
  Administered 2021-10-20: 1

## 2021-10-20 MED ORDER — LIDOCAINE HCL (PF) 2 % IJ SOLN
INTRAMUSCULAR | Status: AC
  Filled 2021-10-20: qty 5

## 2021-10-20 MED ORDER — ONDANSETRON HCL 4 MG/2ML IJ SOLN
INTRAMUSCULAR | Status: DC | PRN
  Administered 2021-10-20: 4 mg via INTRAVENOUS

## 2021-10-20 MED ORDER — OXYCODONE HCL 5 MG PO TABS
ORAL_TABLET | ORAL | Status: AC
  Filled 2021-10-20: qty 1

## 2021-10-20 MED ORDER — SURGIFLO WITH THROMBIN (HEMOSTATIC MATRIX KIT) OPTIME
TOPICAL | Status: DC | PRN
  Administered 2021-10-20: 1

## 2021-10-20 MED ORDER — CEFAZOLIN SODIUM-DEXTROSE 2-4 GM/100ML-% IV SOLN
INTRAVENOUS | Status: AC
  Filled 2021-10-20: qty 100

## 2021-10-20 MED ORDER — PROPOFOL 10 MG/ML IV BOLUS
INTRAVENOUS | Status: AC
  Filled 2021-10-20: qty 20

## 2021-10-20 MED ORDER — LACTATED RINGERS IV SOLN
INTRAVENOUS | Status: DC
Start: 2021-10-20 — End: 2021-10-21

## 2021-10-20 MED ORDER — PROPOFOL 10 MG/ML IV BOLUS
INTRAVENOUS | Status: DC | PRN
  Administered 2021-10-20: 150 mg via INTRAVENOUS

## 2021-10-20 MED ORDER — LIDOCAINE-EPINEPHRINE 1 %-1:100000 IJ SOLN
INTRAMUSCULAR | Status: DC | PRN
  Administered 2021-10-20: 7 mL

## 2021-10-20 MED ORDER — EPHEDRINE SULFATE (PRESSORS) 50 MG/ML IJ SOLN
INTRAMUSCULAR | Status: DC | PRN
  Administered 2021-10-20: 10 mg via INTRAVENOUS

## 2021-10-20 MED ORDER — TRAMADOL HCL 50 MG PO TABS
50.0000 mg | ORAL_TABLET | Freq: Four times a day (QID) | ORAL | Status: DC | PRN
  Administered 2021-10-20 – 2021-10-21 (×3): 50 mg via ORAL

## 2021-10-20 MED ORDER — PHENAZOPYRIDINE HCL 100 MG PO TABS
ORAL_TABLET | ORAL | Status: AC
  Filled 2021-10-20: qty 2

## 2021-10-20 MED ORDER — MIDAZOLAM HCL 2 MG/2ML IJ SOLN
INTRAMUSCULAR | Status: AC
  Filled 2021-10-20: qty 2

## 2021-10-20 MED ORDER — ACETAMINOPHEN 500 MG PO TABS
ORAL_TABLET | ORAL | Status: AC
  Filled 2021-10-20: qty 2

## 2021-10-20 MED ORDER — MIDAZOLAM HCL 5 MG/5ML IJ SOLN
INTRAMUSCULAR | Status: DC | PRN
  Administered 2021-10-20: 2 mg via INTRAVENOUS

## 2021-10-20 MED ORDER — SUGAMMADEX SODIUM 200 MG/2ML IV SOLN
INTRAVENOUS | Status: DC | PRN
  Administered 2021-10-20: 180 mg via INTRAVENOUS

## 2021-10-20 MED ORDER — ACETAMINOPHEN 500 MG PO TABS: 1000.0000 mg | ORAL_TABLET | ORAL | Status: AC

## 2021-10-20 MED ORDER — SIMETHICONE 80 MG PO CHEW
80.0000 mg | CHEWABLE_TABLET | Freq: Four times a day (QID) | ORAL | Status: DC | PRN
  Administered 2021-10-21: 80 mg via ORAL

## 2021-10-20 MED ORDER — CELECOXIB 200 MG PO CAPS
ORAL_CAPSULE | ORAL | Status: AC
  Filled 2021-10-20: qty 2

## 2021-10-20 MED ORDER — PHENAZOPYRIDINE HCL 100 MG PO TABS
200.0000 mg | ORAL_TABLET | ORAL | Status: AC
  Administered 2021-10-20: 200 mg via ORAL

## 2021-10-20 MED ORDER — ONDANSETRON HCL 4 MG/2ML IJ SOLN: 4.0000 mg | Freq: Once | INTRAMUSCULAR | Status: DC | PRN

## 2021-10-20 MED ORDER — DEXMEDETOMIDINE (PRECEDEX) IN NS 20 MCG/5ML (4 MCG/ML) IV SYRINGE
PREFILLED_SYRINGE | INTRAVENOUS | Status: DC | PRN
  Administered 2021-10-20 (×5): 8 ug via INTRAVENOUS

## 2021-10-20 MED ORDER — CEFAZOLIN SODIUM-DEXTROSE 2-4 GM/100ML-% IV SOLN
2.0000 g | INTRAVENOUS | Status: AC
  Administered 2021-10-20: 2 g via INTRAVENOUS

## 2021-10-20 MED ORDER — ARTIFICIAL TEARS OPHTHALMIC OINT
TOPICAL_OINTMENT | OPHTHALMIC | Status: AC
  Filled 2021-10-20: qty 3.5

## 2021-10-20 MED ORDER — ONDANSETRON HCL 4 MG/2ML IJ SOLN: 4.0000 mg | Freq: Four times a day (QID) | INTRAMUSCULAR | Status: DC | PRN

## 2021-10-20 MED ORDER — FENTANYL CITRATE (PF) 100 MCG/2ML IJ SOLN
INTRAMUSCULAR | Status: DC | PRN
  Administered 2021-10-20 (×3): 25 ug via INTRAVENOUS
  Administered 2021-10-20: 100 ug via INTRAVENOUS
  Administered 2021-10-20: 25 ug via INTRAVENOUS

## 2021-10-20 MED ORDER — OXYCODONE HCL 5 MG PO TABS
5.0000 mg | ORAL_TABLET | Freq: Once | ORAL | Status: AC | PRN
  Administered 2021-10-20: 5 mg via ORAL

## 2021-10-20 MED ORDER — FENTANYL CITRATE (PF) 100 MCG/2ML IJ SOLN
INTRAMUSCULAR | Status: AC
  Filled 2021-10-20: qty 2

## 2021-10-20 MED ORDER — DEXMEDETOMIDINE HCL IN NACL 80 MCG/20ML IV SOLN
INTRAVENOUS | Status: AC
  Filled 2021-10-20: qty 20

## 2021-10-20 MED ORDER — LIDOCAINE 2% (20 MG/ML) 5 ML SYRINGE
INTRAMUSCULAR | Status: DC | PRN
  Administered 2021-10-20: 100 mg via INTRAVENOUS

## 2021-10-20 MED ORDER — FENTANYL CITRATE (PF) 250 MCG/5ML IJ SOLN
INTRAMUSCULAR | Status: AC
  Filled 2021-10-20: qty 5

## 2021-10-20 MED ORDER — ROCURONIUM BROMIDE 10 MG/ML (PF) SYRINGE
PREFILLED_SYRINGE | INTRAVENOUS | Status: DC | PRN
  Administered 2021-10-20: 20 mg via INTRAVENOUS
  Administered 2021-10-20: 10 mg via INTRAVENOUS
  Administered 2021-10-20: 20 mg via INTRAVENOUS
  Administered 2021-10-20: 70 mg via INTRAVENOUS
  Administered 2021-10-20: 20 mg via INTRAVENOUS

## 2021-10-20 MED ORDER — TRAMADOL HCL 50 MG PO TABS
ORAL_TABLET | ORAL | Status: AC
  Filled 2021-10-20: qty 1

## 2021-10-20 SURGICAL SUPPLY — 88 items
ADH SKN CLS APL DERMABOND .7 (GAUZE/BANDAGES/DRESSINGS) ×4
AGENT HMST KT MTR STRL THRMB (HEMOSTASIS) ×4
APL PRP STRL LF DISP 70% ISPRP (MISCELLANEOUS) ×8
APL SRG 38 LTWT LNG FL B (MISCELLANEOUS) ×4
APPLICATOR ARISTA FLEXITIP XL (MISCELLANEOUS) ×1 IMPLANT
BLADE CLIPPER SENSICLIP SURGIC (BLADE) ×6 IMPLANT
BLADE SURG 15 STRL LF DISP TIS (BLADE) ×5 IMPLANT
BLADE SURG 15 STRL SS (BLADE) ×5
CATH FOLEY 3WAY  5CC 16FR (CATHETERS) ×5
CATH FOLEY 3WAY 5CC 16FR (CATHETERS) ×5 IMPLANT
CHLORAPREP W/TINT 26 (MISCELLANEOUS) ×7 IMPLANT
COVER BACK TABLE 60X90IN (DRAPES) ×6 IMPLANT
COVER TIP SHEARS 8 DVNC (MISCELLANEOUS) ×5 IMPLANT
COVER TIP SHEARS 8MM DA VINCI (MISCELLANEOUS) ×5
DECANTER SPIKE VIAL GLASS SM (MISCELLANEOUS) ×12 IMPLANT
DEFOGGER SCOPE WARMER CLEARIFY (MISCELLANEOUS) ×6 IMPLANT
DERMABOND ADVANCED (GAUZE/BANDAGES/DRESSINGS) ×1
DERMABOND ADVANCED .7 DNX12 (GAUZE/BANDAGES/DRESSINGS) ×5 IMPLANT
DRAPE ARM DVNC X/XI (DISPOSABLE) ×20 IMPLANT
DRAPE COLUMN DVNC XI (DISPOSABLE) ×5 IMPLANT
DRAPE DA VINCI XI ARM (DISPOSABLE) ×20
DRAPE DA VINCI XI COLUMN (DISPOSABLE) ×5
DRAPE SHEET LG 3/4 BI-LAMINATE (DRAPES) ×6 IMPLANT
DRAPE UTILITY XL STRL (DRAPES) ×6 IMPLANT
ELECT REM PT RETURN 9FT ADLT (ELECTROSURGICAL) ×5
ELECTRODE REM PT RTRN 9FT ADLT (ELECTROSURGICAL) ×5 IMPLANT
GAUZE 4X4 16PLY ~~LOC~~+RFID DBL (SPONGE) ×12 IMPLANT
GLOVE BIO SURGEON STRL SZ 6 (GLOVE) ×18 IMPLANT
GLOVE BIO SURGEON STRL SZ7 (GLOVE) ×3 IMPLANT
GLOVE BIOGEL PI IND STRL 6.5 (GLOVE) ×20 IMPLANT
GLOVE BIOGEL PI IND STRL 7.0 (GLOVE) ×10 IMPLANT
GLOVE BIOGEL PI INDICATOR 6.5 (GLOVE) ×6
GLOVE BIOGEL PI INDICATOR 7.0 (GLOVE) ×4
GLOVE ECLIPSE 6.0 STRL STRAW (GLOVE) ×6 IMPLANT
GOWN STRL REUS W/TWL LRG LVL3 (GOWN DISPOSABLE) ×6 IMPLANT
HEMOSTAT ARISTA ABSORB 3G PWDR (HEMOSTASIS) ×1 IMPLANT
HIBICLENS CHG 4% 4OZ BTL (MISCELLANEOUS) ×6 IMPLANT
HOLDER FOLEY CATH W/STRAP (MISCELLANEOUS) ×6 IMPLANT
IRRIG SUCT STRYKERFLOW 2 WTIP (MISCELLANEOUS) ×5
IRRIGATION SUCT STRKRFLW 2 WTP (MISCELLANEOUS) ×5 IMPLANT
IV NS 1000ML (IV SOLUTION) ×10
IV NS 1000ML BAXH (IV SOLUTION) IMPLANT
KIT TURNOVER CYSTO (KITS) ×6 IMPLANT
LEGGING LITHOTOMY PAIR STRL (DRAPES) ×6 IMPLANT
MANIFOLD NEPTUNE II (INSTRUMENTS) ×6 IMPLANT
MANIPULATOR ADVINCU DEL 3.5 PL (MISCELLANEOUS) ×1 IMPLANT
MESH VERTESSA LITE -Y 2X4X3 (Mesh General) ×6 IMPLANT
NEEDLE HYPO 22GX1.5 SAFETY (NEEDLE) ×6 IMPLANT
NEEDLE INSUFFLATION 120MM (ENDOMECHANICALS) ×6 IMPLANT
NS IRRIG 1000ML POUR BTL (IV SOLUTION) ×6 IMPLANT
OBTURATOR OPTICAL STANDARD 8MM (TROCAR) ×5
OBTURATOR OPTICAL STND 8 DVNC (TROCAR) ×4
OBTURATOR OPTICALSTD 8 DVNC (TROCAR) ×5 IMPLANT
PACK CYSTO (CUSTOM PROCEDURE TRAY) ×6 IMPLANT
PACK ROBOT WH (CUSTOM PROCEDURE TRAY) ×6 IMPLANT
PACK ROBOTIC GOWN (GOWN DISPOSABLE) ×6 IMPLANT
PACK VAGINAL WOMENS (CUSTOM PROCEDURE TRAY) ×6 IMPLANT
PAD OB MATERNITY 4.3X12.25 (PERSONAL CARE ITEMS) ×6 IMPLANT
PAD POSITIONING PINK XL (MISCELLANEOUS) ×6 IMPLANT
PAD PREP 24X48 CUFFED NSTRL (MISCELLANEOUS) ×6 IMPLANT
PROTECTOR NERVE ULNAR (MISCELLANEOUS) ×6 IMPLANT
RETRACTOR LONE STAR DISPOSABLE (INSTRUMENTS) ×6 IMPLANT
RETRACTOR STAY HOOK 5MM (MISCELLANEOUS) ×6 IMPLANT
SEAL CANN UNIV 5-8 DVNC XI (MISCELLANEOUS) ×25 IMPLANT
SEAL XI 5MM-8MM UNIVERSAL (MISCELLANEOUS) ×25
SEALER VESSEL DA VINCI XI (MISCELLANEOUS) ×5
SEALER VESSEL EXT DVNC XI (MISCELLANEOUS) IMPLANT
SET IRRIG Y TYPE TUR BLADDER L (SET/KITS/TRAYS/PACK) ×6 IMPLANT
SET TUBE SMOKE EVAC HIGH FLOW (TUBING) ×6 IMPLANT
SPONGE T-LAP 4X18 ~~LOC~~+RFID (SPONGE) ×1 IMPLANT
SUCTION FRAZIER HANDLE 10FR (MISCELLANEOUS) ×5
SUCTION TUBE FRAZIER 10FR DISP (MISCELLANEOUS) ×5 IMPLANT
SURGIFLO W/THROMBIN 8M KIT (HEMOSTASIS) ×1 IMPLANT
SUT DVC VLOC 180 0 12IN GS21 (SUTURE) ×5
SUT GORETEX NAB #0 THX26 36IN (SUTURE) ×6 IMPLANT
SUT MNCRL AB 4-0 PS2 18 (SUTURE) ×7 IMPLANT
SUT MON AB 2-0 SH 27 (SUTURE) ×6 IMPLANT
SUT VIC AB 0 CT1 27 (SUTURE) ×10
SUT VIC AB 0 CT1 27XBRD ANTBC (SUTURE) ×10 IMPLANT
SUT VIC AB 2-0 SH 27 (SUTURE) ×5
SUT VIC AB 2-0 SH 27XBRD (SUTURE) ×5 IMPLANT
SUT VLOC 180 2-0 9IN GS21 (SUTURE) ×12 IMPLANT
SUTURE DVC VLC 180 0 12IN GS21 (SUTURE) IMPLANT
SYR BULB EAR ULCER 3OZ GRN STR (SYRINGE) ×6 IMPLANT
SYS SLING ADV FIT BLUE TRNSVAG (Sling) ×1 IMPLANT
TOWEL OR 17X26 10 PK STRL BLUE (TOWEL DISPOSABLE) ×6 IMPLANT
TRAY FOLEY W/BAG SLVR 14FR (SET/KITS/TRAYS/PACK) ×6 IMPLANT
WATER STERILE IRR 500ML POUR (IV SOLUTION) ×1 IMPLANT

## 2021-10-20 NOTE — Transfer of Care (Signed)
Immediate Anesthesia Transfer of Care Note  Patient: SHEYNA PETTIBONE  Procedure(s) Performed: XI ROBOTIC ASSISTED TOTAL HYSTERECTOMY WITH RIGHT SALPINGOOPHORECTOMY, LEFT SALPINGECTOMY AND SACROCOLPOPEXY (Abdomen) TRANSVAGINAL TAPE (TVT) PROCEDURE (Vagina ) CYSTOSCOPY (Urethra) EXAM UNDER ANESTHESIA (Abdomen)  Patient Location: PACU  Anesthesia Type:General  Level of Consciousness: sedated  Airway & Oxygen Therapy: Patient Spontanous Breathing and Patient connected to nasal cannula oxygen  Post-op Assessment: Report given to RN  Post vital signs: Reviewed and stable  Last Vitals:  Vitals Value Taken Time  BP 109/62 10/20/21 1618  Temp    Pulse 74 10/20/21 1621  Resp 11 10/20/21 1621  SpO2 100 % 10/20/21 1621  Vitals shown include unvalidated device data.  Last Pain:  Vitals:   10/20/21 1023  TempSrc: Oral         Complications: No notable events documented.

## 2021-10-20 NOTE — Anesthesia Preprocedure Evaluation (Signed)
Anesthesia Evaluation  Patient identified by MRN, date of birth, ID band Patient awake    Reviewed: Allergy & Precautions, NPO status , Patient's Chart, lab work & pertinent test results  History of Anesthesia Complications Negative for: history of anesthetic complications  Airway Mallampati: II  TM Distance: >3 FB Neck ROM: Full    Dental  (+) Dental Advisory Given, Teeth Intact   Pulmonary neg pulmonary ROS,    Pulmonary exam normal        Cardiovascular negative cardio ROS Normal cardiovascular exam     Neuro/Psych negative neurological ROS     GI/Hepatic Neg liver ROS, GERD  ,  Endo/Other  negative endocrine ROS  Renal/GU negative Renal ROS  negative genitourinary   Musculoskeletal negative musculoskeletal ROS (+)   Abdominal   Peds  Hematology  (+) Blood dyscrasia, anemia ,   Anesthesia Other Findings   Reproductive/Obstetrics uterovaginal prolapse, incomplete; anterior vaginal prolapse; stress urinary incontinence                             Anesthesia Physical Anesthesia Plan  ASA: 2  Anesthesia Plan: General   Post-op Pain Management: Tylenol PO (pre-op)* and Toradol IV (intra-op)*   Induction: Intravenous  PONV Risk Score and Plan: 4 or greater and Ondansetron, Dexamethasone, Treatment may vary due to age or medical condition and Midazolam  Airway Management Planned: Oral ETT  Additional Equipment: None  Intra-op Plan:   Post-operative Plan: Extubation in OR  Informed Consent: I have reviewed the patients History and Physical, chart, labs and discussed the procedure including the risks, benefits and alternatives for the proposed anesthesia with the patient or authorized representative who has indicated his/her understanding and acceptance.     Dental advisory given  Plan Discussed with:   Anesthesia Plan Comments:         Anesthesia Quick  Evaluation

## 2021-10-20 NOTE — Anesthesia Procedure Notes (Addendum)
Procedure Name: Intubation Date/Time: 10/20/2021 12:07 PM  Performed by: Bonney Aid, CRNAPre-anesthesia Checklist: Patient identified, Emergency Drugs available, Suction available and Patient being monitored Patient Re-evaluated:Patient Re-evaluated prior to induction Oxygen Delivery Method: Circle system utilized Preoxygenation: Pre-oxygenation with 100% oxygen Induction Type: IV induction Ventilation: Mask ventilation without difficulty Laryngoscope Size: Mac and 3 Grade View: Grade I Tube type: Oral Tube size: 7.0 mm Number of attempts: 1 Airway Equipment and Method: Stylet Placement Confirmation: ETT inserted through vocal cords under direct vision, positive ETCO2 and breath sounds checked- equal and bilateral Secured at: 20 cm Tube secured with: Tape Dental Injury: Teeth and Oropharynx as per pre-operative assessment

## 2021-10-20 NOTE — Discharge Instructions (Addendum)

## 2021-10-20 NOTE — Op Note (Addendum)
Operative Note  Preoperative Diagnosis: anterior vaginal prolapse, posterior vaginal prolapse, uterovaginal prolapse, incomplete, and stress urinary incontinence  Postoperative Diagnosis: same  Procedures performed:  Robotic assisted total laparoscopic hysterectomy, bilateral salpingectomy, right oophorectomy, sacrocolpopexy (vertessa lite Y), midurethral sling (Advantage fit), cystoscopy  Implants:  Implant Name Type Inv. Item Serial No. Manufacturer Lot No. LRB No. Used Action  MESH Valli Glance [id number] [employer]esh [employer] [id number]  1 Implanted  SYS SLING ADV FIT BLUE TRNSVAG - [id number] Sling SYS SLING ADV FIT BLUE [place] [id number]  1 Implanted    Attending Surgeon: Sherlene Shams, MD  Anesthesia: General endotracheal  Findings: 1. On vaginal exam, stage III prolapse noted  2. On laparoscopy, mild adhesions on omentum to the pelvic wall on the right. Right ovary has some small cysts and is densely adhered to the uterus. Normal appearing left ovary and bilateral fallopian tubes.   3. On cystoscopy, normal bladder and urethra without injury, lesion or foreign body. Brisk bilateral ureteral efflux noted.    Specimens:  ID Type Source Tests Collected by Time Destination  1 : uterus, cervix, right tube and ovary, left tube Tissue PATH Gyn benign resection SURGICAL PATHOLOGY Jaquita Folds, MD 10/20/2021 1400     Estimated blood loss: 75 mL  IV fluids: 1000 mL  Urine output: see flowsheet  Complications: none  Procedure in Detail:  After informed consent was obtained, the patient was taken to the operating room, where general anesthesia was induced and found to be adequate. She was placed in dorsolithotomy position in yellowfin stirrups. Her hips were noted not to be hyperflexed or hyperextended. Her arms were padded with gel pads and tucked to her sides. Her hands were surrounded by foam. A padded strap was  placed across her chest with foam between the pad and her skin. She was noted to be appropriately positioned with all pressure points well padded and off tension. A tilt test showed no slippage. She was prepped and draped in the usual sterile fashion. A uterine manipulator was placed in the uterus after sounding to 13 cm, an appropriately sized Koh ring was placed around the cervix, and a pneumo-occluder balloon was positioned in the vagina for later use.  A sterile Foley catheter was inserted.   0.25% plain Marcaine was injected in the supraumbilical  area and an 8 mm supraumbilical skin incision was made with the scalpel.  A Veress needle was inserted into the incision, CO2 insufflation was started, a low opening pressure was noted, and pneumoperitoneum was obtained. The Veress needle was removed and a 7m robotic trocar was placed. The robotic camera was inserted and intraperitoneal placement was confirmed. Survey of the abdomen and pelvis revealed the findings as noted above, specifically mild omental adhesions. The sacrum appeared to be free of any adhesive disease. After determining placement for the other ports, Local anesthetic was injected at each site and two 8 mm incisions were made for robotic ports at 10 cm lateral to and at the level of the umbilical port. Two additional 8 mm incisions were made 10 cm lateral to these and 30 degrees down followed by 8 mm robotic ports - the right side for an assistant port. All trocars were placed sequentially under direct visualization of the camera. The patient was placed in Trendelenburg. Monopolar scissors were inserted and used to take down the omental adhesions on the right. The robot was then docked on the  patient's right side. Vessel sealer alternating with Monopolar endoshears were placed in the right arm, a [place] bipolar grasper was placed in the 2nd arm of the patient's left side, and a Tip up grasper was placed in the 3rd arm on the patient's left  side.   Attention was then turned to the robotic hysterectomy. The right ovary was noted to be densely adhered to the posterior uterus.  Using the monopolar scissors, a window was made on the posterior leaf of the broad ligament.The right round ligament was grasped, cauterized, and transected with electrocautery. The ureter was identified and was found to be well away from the planned site of incision.  The infundibulopelvic ligament was cauterized and transected. The anterior and posterior leaves of the broad ligament were taken down with cautery and sharp dissection. The uterine artery was skeletonized and the bladder flap was created on the right side with a combination of electrosurgery and sharp dissection. The KOH ring was identified. The right uterine artery was clamped, cauterized, and transected. In a similar fashion, the left side was taken down. Ovary on the left was normal and was left in place but left fallopian tube was removed by cauterizing and cutting the mesosalpinx. Further sharp dissection with combination of cautery was performed to further develop the bladder flap. At this point, the KOH ring was completely hugging the cervix. The pneumo-occluder balloon in the vagina was inflated to maintain pneumoperitoneum. A colpotomy was performed with electrosurgical cutting current and the uterus and cervix were completely amputated from the vagina. The specimen was delivered through the vagina. The posterior portion of the vaginal cuff was then grasped and pulled up to maintain pneumoperitoneum. The pneumo-occluder balloon was then replaced in the vagina. The right hand instrument was changed to a suture-cut needle driver. The vaginal cuff was then closed using a 0 V-lock suture.    The right hand instrument was replaced with monopolar endoshears. With a lucite probe in the vagina, the anterior vaginal dissection was then performed with sharp dissection and electrosurgery. The posterior vaginal  dissection was then performed with sharp dissection and electrosurgery in order to dissect the rectum away from the posterior vagina. Attention was then turned to the sacral promontory. The peritoneum overlying the sacral promontory was tented up, dissected sharply with monopolar scissors and electrosurgery using layer by layer technique. A tunnel was created under the peritoneum to the posterior cul de sac to later cover the mesh tail. . This was performed with care to avoid the ureter on the right side and the sigmoid colon and its mesentary on the left side.  A "Y" mesh was then inserted into the abdomen after trimming to appropriate size. With the probe in the vagina, the anterior leaf of the Y mesh was affixed to the anterior portion of the vagina using a 2-0 v-loc suture in a spiral pattern to distribute the suture evenly across the surface of the anterior mesh leaf. In a similar fashion, the posterior leaf of the Y mesh was attached to the posterior surface of the vagina with 2-0 v-loc suture.   The distal end of the mesh was tunneled under the peritoneum then brought to overlie the sacrum. The correct amount of tension was determined in order to elevate the vagina, but not put the mesh under tension. The distal end of the mesh was then affixed to the anterior longitudinal sacral ligament using two interrupted transverse stitches of CV2 Gortex. The excess distal mesh was then cut and  removed. Arista was placed on all operative sites and hemostasis was noted. The open peritoneum was reapproximated over the mesh using 2-0 monocryl. The bladder flap was incorporated to completely retroperitonealize the mesh. All pedicles were carefully inspected and noted to be hemostatic as the CO2 gas was deflated. All instruments were removed from the patient's abdomen.   The Foley catheter was removed.  A 70-degree cystoscope was introduced, and 360-degree inspection revealed no injury, lesion or foreign body in the  bladder. Brisk bilateral ureteral efflux was noted with the assistance of pyridium.  The bladder was drained and the cystoscope was removed.  The Foley catheter was replaced.  The robot was undocked. The CO2 gas was removed and the ports were removed.  The skin incisions were closed with subcutaneous stitches of 4-0 Monocryl and covered with skin glue.   A lonestar self-retraining retractor was placed with 4 stay hooks. The mid urethral area was located on the anterior vaginal wall.  Two Allis clamps were placed at the level of the midurethra. 1% lidocaine with epinephrine was injected into the vaginal mucosa. A vertical incision was made between the two clamps using a 15-blade scalpel.  Using sharp dissection, Metzenbaum scissors were used to make a periurethral tunnel from the vaginal incision towards the pubic rami bilaterally for the future sling tracts. The bladder was ensured to be empty. The trocar and attached sling were introduced into the right side of the periurethral vaginal incision, just inferior to the pubic symphysis on the right side. The trocar was guided through the endopelvic fascia and directly vertically.  While hugging the cephalad surface of the pubic bone, the trocar was guided out through the abdomen 2 fingerbreadths lateral to midline at the level of the pubic symphysis on the ipsilateral side. The trocar was placed on the left side in a similar fashion.  A 70-degree cystoscope was introduced, and 360-degree inspection revealed no trauma or trocars in the bladder, with brisk bilateral ureteral efflux.  The bladder was drained and the cystoscope was removed.  The Foley catheter was reinserted.  The sling was brought to lie beneath the mid-urethra.  A needle driver was placed behind the sling to ensure no tension.   The plastic sheath was removed from the sling and the distal ends of the sling were trimmed just below the level of the skin incisions.  Tension-free positioning of the sling  was confirmed. Vaginal inspection revealed no vaginotomy or sling perforations of the mucosa.  Surgiflo was placed into the incision and good hemostasis was noted. The vaginal mucosal edges were reapproximated using 2-0 Vicryl.  The vagina was copiously irrigated.  Hemostasis was again noted. Vaginal packing not placed. The suprapubic sling incisions were closed with Dermabond. The patient tolerated the procedure well.  An x-ray was performed before final skin closure due to incorrect count and showed no foreign objects in the abdomen or pelvis. She was awakened from anesthesia and transferred to the recovery room in stable condition.   Jaquita Folds, MD

## 2021-10-20 NOTE — Interval H&P Note (Signed)
History and Physical Interval Note:  10/20/2021 11:30 AM  Danielle Campbell  has presented today for surgery, with the diagnosis of uterovaginal prolapse, incomplete; anterior vaginal prolapse; stress urinary incontinence.  The various methods of treatment have been discussed with the patient and family. After consideration of risks, benefits and other options for treatment, the patient has consented to  Procedure(s) with comments: XI ROBOTIC ASSISTED TOTAL HYSTERECTOMY WITH BILATERAL SALPINGECTOMY AND SACROCOLPOPEXY (N/A)  TRANSVAGINAL TAPE (TVT) PROCEDURE (N/A) CYSTOSCOPY (N/A) as a surgical intervention.  The patient's history has been reviewed, patient examined, no change in status, stable for surgery.  I have reviewed the patient's chart and labs.  Questions were answered to the patient's satisfaction.     Jaquita Folds

## 2021-10-21 ENCOUNTER — Encounter: Payer: Self-pay | Admitting: *Deleted

## 2021-10-21 MED ORDER — TRAMADOL HCL 50 MG PO TABS
ORAL_TABLET | ORAL | Status: AC
  Filled 2021-10-21: qty 1

## 2021-10-21 MED ORDER — FENTANYL CITRATE (PF) 100 MCG/2ML IJ SOLN
INTRAMUSCULAR | Status: AC
  Filled 2021-10-21: qty 2

## 2021-10-21 MED ORDER — ACETAMINOPHEN 325 MG PO TABS
ORAL_TABLET | ORAL | Status: AC
  Filled 2021-10-21: qty 2

## 2021-10-21 MED ORDER — SIMETHICONE 80 MG PO CHEW
CHEWABLE_TABLET | ORAL | Status: AC
  Filled 2021-10-21: qty 1

## 2021-10-21 MED ORDER — WHITE PETROLATUM EX OINT
TOPICAL_OINTMENT | CUTANEOUS | Status: AC
  Filled 2021-10-21: qty 5

## 2021-10-21 NOTE — Progress Notes (Signed)
Fentanyl 25 mcg, in bedside drug container, from yesterday wasted and witnessed by Danny Lawless ,RN

## 2021-10-21 NOTE — Discharge Summary (Signed)
Physician Discharge Summary  Patient ID: Danielle Campbell MRN: [id number] DOB/AGE: Oct 10, 1975 46 y.o.  Admit date: 10/20/2021 Discharge date: 10/21/2021  Admission Diagnoses:  Discharge Diagnoses:  Principal Problem:   Uterovaginal prolapse, incomplete   Discharged Condition: good  Hospital Course: 48yoF who presented on 10/20/21 for Robotic assisted total laparoscopic hysterectomy, bilateral salpingectomy, right oophorectomy, sacrocolpopexy (vertessa lite Y), midurethral sling (Advantage fit), cystoscopy. Post operatively, her pain was well controlled, she ambulated and tolerated regular diet. She passed her voiding trial. She was discharged on POD#1.   Consults: None  Significant Diagnostic Studies: n/a  Treatments: surgery: see above  Discharge Exam: Blood pressure (!) 108/59, pulse 81, temperature 98.2 F (36.8 C), resp. rate 18, height '5\' 4"'$  (1.626 m), weight 86.9 kg, last menstrual period 09/12/2021, SpO2 96 %. General appearance: alert and cooperative Resp: clear to auscultation bilaterally Cardio: regular rate and rhythm, S1, S2 normal, no murmur, click, rub or gallop GI: soft, non-tender; bowel sounds normal; no masses,  no organomegaly and laparoscopic incisions C/D/I Extremities: extremities normal, atraumatic, no cyanosis or edema and SCDs in place  Disposition: Discharge disposition: 01-Home or Self Care       Discharge Instructions     Call MD for:  persistant nausea and vomiting   Complete by: As directed    Call MD for:  redness, tenderness, or signs of infection (pain, swelling, redness, odor or green/yellow discharge around incision site)   Complete by: As directed    Call MD for:  severe uncontrolled pain   Complete by: As directed    Call MD for:  temperature >100.4   Complete by: As directed    Diet general   Complete by: As directed    Increase activity slowly   Complete by: As directed    May walk up steps   Complete by: As  directed    No wound care   Complete by: As directed       Allergies as of 10/21/2021       Reactions   Hydrocodone Anaphylaxis        Medication List     TAKE these medications    acetaminophen 500 MG tablet Commonly known as: TYLENOL Take 1 tablet (500 mg total) by mouth every 6 (six) hours as needed (pain).   ibuprofen 600 MG tablet Commonly known as: ADVIL Take 1 tablet (600 mg total) by mouth every 6 (six) hours as needed.   MACA ROOT PO Take by mouth daily.   Magnesium Citrate 100 MG Tabs Take by mouth every evening.   naproxen 500 MG tablet Commonly known as: NAPROSYN Take 1 tablet (500 mg total) by mouth 2 (two) times daily.   polyethylene glycol powder 17 GM/SCOOP powder Commonly known as: GLYCOLAX/MIRALAX Take 17 g by mouth daily. Drink 17g (1 scoop) dissolved in water per day.   Potassium 95 MG Tabs Take by mouth.   VALERIAN ROOT PO Take by mouth every evening.   vitamin B-12 1000 MCG tablet Commonly known as: CYANOCOBALAMIN Take 1,000 mcg by mouth daily.   WOMENS MULTIVITAMIN PO Take by mouth.         Signed: Jaquita Folds 10/21/2021, 9:02 AM

## 2021-10-21 NOTE — Progress Notes (Signed)
Voiding trial performed.  275cc NS instilled through foley cath.  Catheter removed w/o difficulty.  Pt up to BR and voided 250cc clear yellow urine.  Bladder scan performed 3 times w/ 0cc result each time.

## 2021-10-22 ENCOUNTER — Encounter (HOSPITAL_BASED_OUTPATIENT_CLINIC_OR_DEPARTMENT_OTHER): Payer: Self-pay | Admitting: Obstetrics and Gynecology

## 2021-10-22 LAB — SURGICAL PATHOLOGY

## 2021-10-22 NOTE — Anesthesia Postprocedure Evaluation (Signed)
Anesthesia Post Note  Patient: Danielle Campbell  Procedure(s) Performed: XI ROBOTIC ASSISTED TOTAL HYSTERECTOMY WITH RIGHT SALPINGOOPHORECTOMY, LEFT SALPINGECTOMY AND SACROCOLPOPEXY (Abdomen) TRANSVAGINAL TAPE (TVT) PROCEDURE (Vagina ) CYSTOSCOPY (Urethra) EXAM UNDER ANESTHESIA (Abdomen)     Patient location during evaluation: PACU Anesthesia Type: General Level of consciousness: awake and alert Pain management: pain level controlled Vital Signs Assessment: post-procedure vital signs reviewed and stable Respiratory status: spontaneous breathing, nonlabored ventilation and respiratory function stable Cardiovascular status: blood pressure returned to baseline and stable Postop Assessment: no apparent nausea or vomiting Anesthetic complications: no   No notable events documented.  Last Vitals:  Vitals:   10/21/21 0540 10/21/21 0819  BP: 109/61 (!) 108/59  Pulse: 78 81  Resp: 16 18  Temp: 37.2 C 36.8 C  SpO2: 96% 96%    Last Pain:  Vitals:   10/21/21 0819  TempSrc:   PainSc: 7                  Lidia Collum

## 2021-11-23 ENCOUNTER — Encounter: Payer: Self-pay | Admitting: Obstetrics and Gynecology

## 2021-11-23 ENCOUNTER — Ambulatory Visit (INDEPENDENT_AMBULATORY_CARE_PROVIDER_SITE_OTHER): Payer: Self-pay | Admitting: Obstetrics and Gynecology

## 2021-11-23 VITALS — BP 132/84 | HR 101

## 2021-11-23 DIAGNOSIS — Z9889 Other specified postprocedural states: Secondary | ICD-10-CM

## 2021-11-23 NOTE — Progress Notes (Unsigned)
[facility]  Date of Visit: 11/23/2021  History of Present Illness: [name] is a 46 y.o. female scheduled today for a post-operative visit.   Surgery: s/p Robotic assisted total laparoscopic hysterectomy, bilateral salpingectomy, right oophorectomy, sacrocolpopexy (vertessa lite Y), midurethral sling (Advantage fit), cystoscopy on 10/20/21  She {passed:24781} her postoperative void trial.   Postoperative course ***uncomplicated.   Today she reports ***  UTI in the last 6 weeks? {YES/NO:21197} Pain? {YES/NO:21197} She has returned to her normal activity (except for postop restrictions) Vaginal bulge? No  Stress incontinence: No  Urgency/frequency: No  Urge incontinence: No  Voiding dysfunction: No  Bowel issues: No   Subjective Success: Do you usually have a bulge or something falling out that you can see or feel in the vaginal area? {YES/NO:21197} Retreatment Success: Any retreatment with surgery or pessary for any compartment? 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      Nabothian cyst and slight cervicitis.      Margins negative for CIN-1.  - Endometrium      Inactive.      Negative for atypia or malignancy.  - Myometrium      Adenomyosis      Negative for malignancy.  - Right ovary      Hemorrhagic follicular cyst.      Negative for endometriosis or malignancy.  - Right fallopian tube      Benign paratubal cyst.      Negative for endometriosis or malignancy.  - Left fallopian tube      Unremarkable.      Negative for endometriosis or malignancy.   Medications: She has a current medication list which includes the following prescription(s): acetaminophen, ibuprofen, maca root, magnesium citrate, multiple vitamins-minerals, naproxen, potassium, valerian, and cyanocobalamin.   Allergies:  Patient is allergic to hydrocodone.   Physical Exam: BP 132/84   Pulse (!) 101   Abdomen: {Exam; abdomen:14935::"soft, non-tender, without masses or organomegaly"} ***Incisions: {Exam; incision:13523}.  Pelvic Examination: Vagina: Incisions {Exam; incision:13523}. Sutures are {DESC; PRESENT/NOT PRESENT:21021351} at incision line and there {ACTION; IS/IS YYT:[id number]} granulation tissue. No tenderness along the anterior or posterior vagina. No apical tenderness. No pelvic masses. ***No visible or palpable mesh.  POP-Q:     No data to display         ---------------------------------------------------------  Assessment and Plan: No diagnosis found.  - ***Pathology results were reviewed with the patient today and she verbalized understanding that the results were ***benign.  - ***The patient was given a copy of her operative note ***and pathology report*** for her records. - Can resume regular activity including exercise and intercourse,  if desired.  - Discussed avoidance of heavy lifting and straining long term to reduce the risk of recurrance.   All questions answered.   No follow-ups on file.

## 2021-11-24 ENCOUNTER — Encounter: Payer: Self-pay | Admitting: Obstetrics and Gynecology

## 2022-10-06 ENCOUNTER — Emergency Department (HOSPITAL_COMMUNITY)
Admission: EM | Admit: 2022-10-06 | Discharge: 2022-10-06 | Disposition: A | Discharge status: Home / Self Care | Payer: Self-pay | Attending: Emergency Medicine | Admitting: Emergency Medicine

## 2022-10-06 ENCOUNTER — Other Ambulatory Visit: Payer: Self-pay

## 2022-10-06 ENCOUNTER — Encounter (HOSPITAL_COMMUNITY): Payer: Self-pay | Admitting: *Deleted

## 2022-10-06 ENCOUNTER — Emergency Department (HOSPITAL_COMMUNITY): Payer: Self-pay

## 2022-10-06 DIAGNOSIS — K219 Gastro-esophageal reflux disease without esophagitis: Secondary | ICD-10-CM | POA: Insufficient documentation

## 2022-10-06 DIAGNOSIS — K2970 Gastritis, unspecified, without bleeding: Secondary | ICD-10-CM

## 2022-10-06 DIAGNOSIS — D649 Anemia, unspecified: Secondary | ICD-10-CM | POA: Insufficient documentation

## 2022-10-06 DIAGNOSIS — Z1152 Encounter for screening for COVID-19: Secondary | ICD-10-CM | POA: Insufficient documentation

## 2022-10-06 LAB — URINALYSIS, ROUTINE W REFLEX MICROSCOPIC
Bilirubin Urine: NEGATIVE
Glucose, UA: NEGATIVE mg/dL
Hgb urine dipstick: NEGATIVE
Ketones, ur: NEGATIVE mg/dL
Leukocytes,Ua: NEGATIVE
Nitrite: NEGATIVE
Protein, ur: NEGATIVE mg/dL
Specific Gravity, Urine: 1.01 (ref 1.005–1.030)
pH: 6 (ref 5.0–8.0)

## 2022-10-06 LAB — CBC
HCT: 35.6 % — ABNORMAL LOW (ref 36.0–46.0)
Hemoglobin: 10.8 g/dL — ABNORMAL LOW (ref 12.0–15.0)
MCH: 24.2 pg — ABNORMAL LOW (ref 26.0–34.0)
MCHC: 30.3 g/dL (ref 30.0–36.0)
MCV: 79.8 fL — ABNORMAL LOW (ref 80.0–100.0)
Platelets: 438 10*3/uL — ABNORMAL HIGH (ref 150–400)
RBC: 4.46 MIL/uL (ref 3.87–5.11)
RDW: 15.6 % — ABNORMAL HIGH (ref 11.5–15.5)
WBC: 8.5 10*3/uL (ref 4.0–10.5)
nRBC: 0 % (ref 0.0–0.2)

## 2022-10-06 LAB — COMPREHENSIVE METABOLIC PANEL
ALT: 33 U/L (ref 0–44)
AST: 28 U/L (ref 15–41)
Albumin: 3.6 g/dL (ref 3.5–5.0)
Alkaline Phosphatase: 111 U/L (ref 38–126)
Anion gap: 11 (ref 5–15)
BUN: 9 mg/dL (ref 6–20)
CO2: 23 mmol/L (ref 22–32)
Calcium: 9 mg/dL (ref 8.9–10.3)
Chloride: 104 mmol/L (ref 98–111)
Creatinine, Ser: 0.68 mg/dL (ref 0.44–1.00)
GFR, Estimated: >60 mL/min (ref >60)
Glucose, Bld: 127 mg/dL — ABNORMAL HIGH (ref 70–99)
Potassium: 3.4 mmol/L — ABNORMAL LOW (ref 3.5–5.1)
Sodium: 138 mmol/L (ref 135–145)
Total Bilirubin: 0.4 mg/dL (ref 0.3–1.2)
Total Protein: 7.7 g/dL (ref 6.5–8.1)

## 2022-10-06 LAB — RESP PANEL BY RT-PCR (RSV, FLU A&B, COVID)  RVPGX2
Influenza A by PCR: NEGATIVE
Influenza B by PCR: NEGATIVE
Resp Syncytial Virus by PCR: NEGATIVE
SARS Coronavirus 2 by RT PCR: NEGATIVE

## 2022-10-06 LAB — LIPASE, BLOOD: Lipase: 40 U/L (ref 11–51)

## 2022-10-06 MED ORDER — ONDANSETRON 4 MG PO TBDP: 4.0000 mg | ORAL_TABLET | Freq: Three times a day (TID) | ORAL | 0 refills | Status: DC | PRN

## 2022-10-06 MED ORDER — FAMOTIDINE 20 MG PO TABS: 20.0000 mg | ORAL_TABLET | Freq: Two times a day (BID) | ORAL | 0 refills | Status: AC

## 2022-10-06 MED ORDER — IOHEXOL 350 MG/ML SOLN
75.0000 mL | Freq: Once | INTRAVENOUS | Status: AC | PRN
  Administered 2022-10-06: 75 mL via INTRAVENOUS

## 2022-10-06 NOTE — H&P (Signed)
CC/Reason for consult: Possible appendicitis  Requesting physician: Vivi Barrack MD  HPI: Danielle Campbell is an 47 y.o. female with hx of GERD, dysmenorrhea, pelvic floor prolapse, presented to emergency department with an a 3-day history of vague abdominal discomfort and nausea as well as some diarrhea.  She reports that she has more of a bloating type sensation and feeling some swollen this to her abdomen but denies any specific pain.  She reports that any discomfort she has felt has been in the supraumbilical to subxiphoid region.  She specifically denies any pain whatsoever in her right hemiabdomen including in her right lower quadrant or pelvis.  She reports that her symptoms today actually seem better from a discomfort standpoint than they did yesterday.  Her pain is described as being crampy in nature.  She denies any evident blood in her stool.  Does not believe she is ever experienced this kind of symptom before.  Not sure if she has ever had a colonoscopy but believes she has not.  PSH: Lap chole (Dr. Derrell Lolling, 2015); bladder suspension with robotic Hyst/BSO (7/20203)  Past Medical History:  Diagnosis Date   Dysmenorrhea 2018   GERD (gastroesophageal reflux disease)    Positive TB test    normal chest x-ray on 06/19/2020, as of 10/07/21, pt states she has never been sick with TB or had any symptoms   Uterus prolapse    Wears glasses     Past Surgical History:  Procedure Laterality Date   BLADDER SUSPENSION N/A 10/20/2021   Procedure: TRANSVAGINAL TAPE (TVT) PROCEDURE;  Surgeon: Marguerita Beards, MD;  [facility];  Service: Gynecology;  Laterality: N/A;   CHOLECYSTECTOMY N/A 10/21/2013   Procedure: LAPAROSCOPIC CHOLECYSTECTOMY WITH INTRAOPERATIVE CHOLANGIOGRAM;  Surgeon: Axel Filler, MD;  Location: [facility] OR;  Service: General;  Laterality: N/A;   CYSTOSCOPY N/A 10/20/2021   Procedure: CYSTOSCOPY;  Surgeon: Marguerita Beards, MD;   Location: [facility];  Service: Gynecology;  Laterality: N/A;   XI ROBOTIC ASSISTED TOTAL HYSTERECTOMY WITH SACROCOLPOPEXY N/A 10/20/2021   Procedure: XI ROBOTIC ASSISTED TOTAL HYSTERECTOMY WITH RIGHT SALPINGOOPHORECTOMY, LEFT SALPINGECTOMY AND SACROCOLPOPEXY;  Surgeon: Marguerita Beards, MD;  Location: [facility];  Service: Gynecology;  Laterality: N/A;  total time requested is 3.5 hours    Family History  Problem Relation Age of Onset   Hypercholesterolemia Mother    Breast cancer Neg Hx     Social:  reports that she has never smoked. She has never used smokeless tobacco. She reports that she does not drink alcohol and does not use drugs.  Allergies:  Allergies  Allergen Reactions   Hydrocodone Anaphylaxis    Medications: I have reviewed the patient's current medications.  Results for orders placed or performed during the hospital encounter of 10/06/22 (from the past 48 hour(s))  Resp panel by RT-PCR (RSV, Flu A&B, Covid) Anterior Nasal Swab     Status: None   Collection Time: 10/06/22  2:01 PM   Specimen: Anterior Nasal Swab  Result Value Ref Range   SARS Coronavirus 2 by RT PCR NEGATIVE NEGATIVE   Influenza A by PCR NEGATIVE NEGATIVE   Influenza B by PCR NEGATIVE NEGATIVE    Comment: (NOTE) The Xpert Xpress SARS-CoV-2/FLU/RSV plus assay is intended as an aid in the diagnosis of influenza from Nasopharyngeal swab specimens and should not be used as a sole basis for treatment. Nasal washings and aspirates are unacceptable for Xpert Xpress SARS-CoV-2/FLU/RSV testing.  Fact Sheet for Patients: BloggerCourse.com  Fact Sheet for Healthcare Providers: SeriousBroker.it  This test is not yet approved or cleared by the [place] FDA and has been authorized for detection and/or diagnosis of SARS-CoV-2 by FDA under an Emergency Use Authorization (EUA). This EUA will remain in effect (meaning  this test can be used) for the duration of the COVID-19 declaration under Section 564(b)(1) of the Act, 21 U.S.C. section 360bbb-3(b)(1), unless the authorization is terminated or revoked.     Resp Syncytial Virus by PCR NEGATIVE NEGATIVE    Comment: (NOTE) Fact Sheet for Patients: BloggerCourse.com  Fact Sheet for Healthcare Providers: SeriousBroker.it  This test is not yet approved or cleared by the [place] FDA and has been authorized for detection and/or diagnosis of SARS-CoV-2 by FDA under an Emergency Use Authorization (EUA). This EUA will remain in effect (meaning this test can be used) for the duration of the COVID-19 declaration under Section 564(b)(1) of the Act, 21 U.S.C. section 360bbb-3(b)(1), unless the authorization is terminated or revoked.  Performed at [facility], [address], [place], [place] [address]   Urinalysis, Routine w reflex microscopic -Urine, Clean Catch     Status: None   Collection Time: 10/06/22  2:06 PM  Result Value Ref Range   Color, Urine YELLOW YELLOW   APPearance CLEAR CLEAR   Specific Gravity, Urine 1.010 1.005 - 1.030   pH 6.0 5.0 - 8.0   Glucose, UA NEGATIVE NEGATIVE mg/dL   Hgb urine dipstick NEGATIVE NEGATIVE   Bilirubin Urine NEGATIVE NEGATIVE   Ketones, ur NEGATIVE NEGATIVE mg/dL   Protein, ur NEGATIVE NEGATIVE mg/dL   Nitrite NEGATIVE NEGATIVE   Leukocytes,Ua NEGATIVE NEGATIVE    Comment: Microscopic not done on urines with negative protein, blood, leukocytes, nitrite, or glucose < 500 mg/dL. Performed at [facility], [address], [place], [place] [address]   Lipase, blood     Status: None   Collection Time: 10/06/22  2:08 PM  Result Value Ref Range   Lipase 40 11 - 51 U/L    Comment: Performed at [facility], [address] [place], [place] [address]  Comprehensive metabolic panel     Status: Abnormal   Collection Time: 10/06/22  2:08 PM   Result Value Ref Range   Sodium 138 135 - 145 mmol/L   Potassium 3.4 (L) 3.5 - 5.1 mmol/L   Chloride 104 98 - 111 mmol/L   CO2 23 22 - 32 mmol/L   Glucose, Bld 127 (H) 70 - 99 mg/dL    Comment: Glucose reference range applies only to samples taken after fasting for at least 8 hours.   BUN 9 6 - 20 mg/dL   Creatinine, Ser 0.86 0.44 - 1.00 mg/dL   Calcium 9.0 8.9 - 57.8 mg/dL   Total Protein 7.7 6.5 - 8.1 g/dL   Albumin 3.6 3.5 - 5.0 g/dL   AST 28 15 - 41 U/L   ALT 33 0 - 44 U/L   Alkaline Phosphatase 111 38 - 126 U/L   Total Bilirubin 0.4 0.3 - 1.2 mg/dL   GFR, Estimated >46 >96 mL/min    Comment: (NOTE) Calculated using the CKD-EPI Creatinine Equation (2021)    Anion gap 11 5 - 15    Comment: Performed at [facility], [address] [place], [place] [address]  CBC     Status: Abnormal   Collection Time: 10/06/22  2:08 PM  Result Value Ref Range  WBC 8.5 4.0 - 10.5 K/uL   RBC 4.46 3.87 - 5.11 MIL/uL   Hemoglobin 10.8 (L) 12.0 - 15.0 g/dL   HCT 45.4 (L) 09.8 - 11.9 %   MCV 79.8 (L) 80.0 - 100.0 fL   MCH 24.2 (L) 26.0 - 34.0 pg   MCHC 30.3 30.0 - 36.0 g/dL   RDW 14.7 (H) 82.9 - 56.2 %   Platelets 438 (H) 150 - 400 K/uL   nRBC 0.0 0.0 - 0.2 %    Comment: Performed at [facility], [address] [place], [place] [address]    CT ABDOMEN PELVIS W CONTRAST  Result Date: 10/06/2022 CLINICAL DATA:  Abdominal pain and fever EXAM: CT ABDOMEN AND PELVIS WITH CONTRAST TECHNIQUE: Multidetector CT imaging of the abdomen and pelvis was performed using the standard protocol following bolus administration of intravenous contrast. RADIATION DOSE REDUCTION: This exam was performed according to the departmental dose-optimization program which includes automated exposure control, adjustment of the mA and/or kV according to patient size and/or use of iterative reconstruction technique. CONTRAST:  75mL OMNIPAQUE IOHEXOL 350 MG/ML SOLN COMPARISON:  CT abdomen and pelvis with contrast  July tenth 2023 FINDINGS: Lower chest: The visualized bibasilar lungs clear. Normal heart size and no pericardial effusion. Hepatobiliary: No focal liver abnormality is seen. Status post cholecystectomy. No biliary dilatation. Pancreas: Unremarkable. No pancreatic ductal dilatation or surrounding inflammatory changes. Spleen: Normal in size without focal abnormality. Adrenals/Urinary Tract: Adrenal glands are unremarkable. Kidneys are normal, without renal calculi, focal lesion, or hydronephrosis. The bladder is distended but no bladder wall thickening or adjacent fat stranding. There is a single locule of air within the bladder. Stomach/Bowel: Stomach is within normal limits. The appendix is minimally dilated with minimal surrounding fat stranding. There is no evidence of appendicolith, extraluminal air, or adjacent fluid collection. Colonic diverticulosis without evidence of diverticulitis. No evidence of bowel wall thickening, distention, or inflammatory changes. Vascular/Lymphatic: No significant vascular findings are present. No enlarged abdominal or pelvic lymph nodes. Reproductive: The uterus is not visualized.  No adnexal masses. Other: No abdominal wall hernia or abnormality. No abdominopelvic ascites. Musculoskeletal: No acute or significant osseous findings. IMPRESSION: 1. The appendix is minimally dilated with minimal surrounding fat stranding, which may represent early uncomplicated appendicitis. No evidence of appendicolith, extraluminal air, or adjacent fluid collections. 2. Colonic diverticulosis without evidence of diverticulitis. 3. There is a single locule of air within the bladder, of uncertain significance. The bladder is otherwise within normal limits. Recommend correlation with history of catheterization and urinalysis. Electronically Signed   By: Jacob Moores M.D.   On: 10/06/2022 17:16    ROS - all of the below systems have been reviewed with the patient and positives are indicated  with bold text General: chills, fever or night sweats Eyes: blurry vision or double vision ENT: epistaxis or sore throat Allergy/Immunology: itchy/watery eyes or nasal congestion Hematologic/Lymphatic: bleeding problems, blood clots or swollen lymph nodes Endocrine: temperature intolerance or unexpected weight changes Breast: new or changing breast lumps or nipple discharge Resp: cough, shortness of breath, or wheezing CV: chest pain or dyspnea on exertion GI: as per HPI GU: dysuria, trouble voiding, or hematuria MSK: joint pain or joint stiffness Neuro: TIA or stroke symptoms Derm: pruritus and skin lesion changes Psych: anxiety and depression  PE Blood pressure (!) 145/80, pulse 81, temperature 98.8 F (37.1 C), resp. rate 16, height 5\' 4"  (1.626 m), weight 85.3 kg, last menstrual period 09/12/2021, SpO2 98 %. Constitutional: NAD; conversant  Lungs: Normal respiratory effort CV: RRR GI: Abd soft, nontender throughout, nondistended MSK: Normal range of motion of extremities; no clubbing/cyanosis Psychiatric: Appropriate affect  Results for orders placed or performed during the hospital encounter of 10/06/22 (from the past 48 hour(s))  Resp panel by RT-PCR (RSV, Flu A&B, Covid) Anterior Nasal Swab     Status: None   Collection Time: 10/06/22  2:01 PM   Specimen: Anterior Nasal Swab  Result Value Ref Range   SARS Coronavirus 2 by RT PCR NEGATIVE NEGATIVE   Influenza A by PCR NEGATIVE NEGATIVE   Influenza B by PCR NEGATIVE NEGATIVE    Comment: (NOTE) The Xpert Xpress SARS-CoV-2/FLU/RSV plus assay is intended as an aid in the diagnosis of influenza from Nasopharyngeal swab specimens and should not be used as a sole basis for treatment. Nasal washings and aspirates are unacceptable for Xpert Xpress SARS-CoV-2/FLU/RSV testing.  Fact Sheet for Patients: BloggerCourse.com  Fact Sheet for Healthcare  Providers: SeriousBroker.it  This test is not yet approved or cleared by the [place] FDA and has been authorized for detection and/or diagnosis of SARS-CoV-2 by FDA under an Emergency Use Authorization (EUA). This EUA will remain in effect (meaning this test can be used) for the duration of the COVID-19 declaration under Section 564(b)(1) of the Act, 21 U.S.C. section 360bbb-3(b)(1), unless the authorization is terminated or revoked.     Resp Syncytial Virus by PCR NEGATIVE NEGATIVE    Comment: (NOTE) Fact Sheet for Patients: BloggerCourse.com  Fact Sheet for Healthcare Providers: SeriousBroker.it  This test is not yet approved or cleared by the [place] FDA and has been authorized for detection and/or diagnosis of SARS-CoV-2 by FDA under an Emergency Use Authorization (EUA). This EUA will remain in effect (meaning this test can be used) for the duration of the COVID-19 declaration under Section 564(b)(1) of the Act, 21 U.S.C. section 360bbb-3(b)(1), unless the authorization is terminated or revoked.  Performed at [facility], [address] [place], [place] [address]   Urinalysis, Routine w reflex microscopic -Urine, Clean Catch     Status: None   Collection Time: 10/06/22  2:06 PM  Result Value Ref Range   Color, Urine YELLOW YELLOW   APPearance CLEAR CLEAR   Specific Gravity, Urine 1.010 1.005 - 1.030   pH 6.0 5.0 - 8.0   Glucose, UA NEGATIVE NEGATIVE mg/dL   Hgb urine dipstick NEGATIVE NEGATIVE   Bilirubin Urine NEGATIVE NEGATIVE   Ketones, ur NEGATIVE NEGATIVE mg/dL   Protein, ur NEGATIVE NEGATIVE mg/dL   Nitrite NEGATIVE NEGATIVE   Leukocytes,Ua NEGATIVE NEGATIVE    Comment: Microscopic not done on urines with negative protein, blood, leukocytes, nitrite, or glucose < 500 mg/dL. Performed at [facility], [address], [place], [place] [address]   Lipase, blood      Status: None   Collection Time: 10/06/22  2:08 PM  Result Value Ref Range   Lipase 40 11 - 51 U/L    Comment: Performed at [facility], [address], [place], [place] [address]  Comprehensive metabolic panel     Status: Abnormal   Collection Time: 10/06/22  2:08 PM  Result Value Ref Range   Sodium 138 135 - 145 mmol/L   Potassium 3.4 (L) 3.5 - 5.1 mmol/L   Chloride 104 98 - 111 mmol/L   CO2 23 22 - 32 mmol/L   Glucose, Bld 127 (H) 70 - 99 mg/dL    Comment: Glucose reference range applies  only to samples taken after fasting for at least 8 hours.   BUN 9 6 - 20 mg/dL   Creatinine, Ser 4.09 0.44 - 1.00 mg/dL   Calcium 9.0 8.9 - 81.1 mg/dL   Total Protein 7.7 6.5 - 8.1 g/dL   Albumin 3.6 3.5 - 5.0 g/dL   AST 28 15 - 41 U/L   ALT 33 0 - 44 U/L   Alkaline Phosphatase 111 38 - 126 U/L   Total Bilirubin 0.4 0.3 - 1.2 mg/dL   GFR, Estimated >91 >47 mL/min    Comment: (NOTE) Calculated using the CKD-EPI Creatinine Equation (2021)    Anion gap 11 5 - 15    Comment: Performed at [facility], [address] [place], [place] [address]  CBC     Status: Abnormal   Collection Time: 10/06/22  2:08 PM  Result Value Ref Range   WBC 8.5 4.0 - 10.5 K/uL   RBC 4.46 3.87 - 5.11 MIL/uL   Hemoglobin 10.8 (L) 12.0 - 15.0 g/dL   HCT 21.3 (L) 08.6 - 57.8 %   MCV 79.8 (L) 80.0 - 100.0 fL   MCH 24.2 (L) 26.0 - 34.0 pg   MCHC 30.3 30.0 - 36.0 g/dL   RDW 46.9 (H) 62.9 - 52.8 %   Platelets 438 (H) 150 - 400 K/uL   nRBC 0.0 0.0 - 0.2 %    Comment: Performed at [facility], [address] [place], [place] [address]    CT ABDOMEN PELVIS W CONTRAST  Result Date: 10/06/2022 CLINICAL DATA:  Abdominal pain and fever EXAM: CT ABDOMEN AND PELVIS WITH CONTRAST TECHNIQUE: Multidetector CT imaging of the abdomen and pelvis was performed using the standard protocol following bolus administration of intravenous contrast. RADIATION DOSE REDUCTION: This exam was performed according to the  departmental dose-optimization program which includes automated exposure control, adjustment of the mA and/or kV according to patient size and/or use of iterative reconstruction technique. CONTRAST:  75mL OMNIPAQUE IOHEXOL 350 MG/ML SOLN COMPARISON:  CT abdomen and pelvis with contrast July tenth 2023 FINDINGS: Lower chest: The visualized bibasilar lungs clear. Normal heart size and no pericardial effusion. Hepatobiliary: No focal liver abnormality is seen. Status post cholecystectomy. No biliary dilatation. Pancreas: Unremarkable. No pancreatic ductal dilatation or surrounding inflammatory changes. Spleen: Normal in size without focal abnormality. Adrenals/Urinary Tract: Adrenal glands are unremarkable. Kidneys are normal, without renal calculi, focal lesion, or hydronephrosis. The bladder is distended but no bladder wall thickening or adjacent fat stranding. There is a single locule of air within the bladder. Stomach/Bowel: Stomach is within normal limits. The appendix is minimally dilated with minimal surrounding fat stranding. There is no evidence of appendicolith, extraluminal air, or adjacent fluid collection. Colonic diverticulosis without evidence of diverticulitis. No evidence of bowel wall thickening, distention, or inflammatory changes. Vascular/Lymphatic: No significant vascular findings are present. No enlarged abdominal or pelvic lymph nodes. Reproductive: The uterus is not visualized.  No adnexal masses. Other: No abdominal wall hernia or abnormality. No abdominopelvic ascites. Musculoskeletal: No acute or significant osseous findings. IMPRESSION: 1. The appendix is minimally dilated with minimal surrounding fat stranding, which may represent early uncomplicated appendicitis. No evidence of appendicolith, extraluminal air, or adjacent fluid collections. 2. Colonic diverticulosis without evidence of diverticulitis. 3. There is a single locule of air within the bladder, of uncertain significance. The  bladder is otherwise within normal limits. Recommend correlation with history of catheterization and urinalysis. Electronically Signed   By: Cordelia Pen.D.  On: 10/06/2022 17:16    A/P: Danielle Campbell is an 47 y.o. female with GERD, dysmenorrhea, pelvic floor prolapse - now with vague abdominal discomfort over 3 days, diarrhea, symptoms actually impoved relative to yesterday  -Afebrile, WBC 10 -No tenderness on exam, including to deep palpation the right lower quadrant.  Her symptoms began now at least 3 days ago. Looking at her CT scan, there is minimal if any periappendiceal stranding and her appendix does reside in her right lower quadrant.  That said, with her symptoms having began at least 3 days ago, would expect there to be more evidence of appendicitis on her CT scan if this were in fact the cause and additionally, her to exhibit at least some tenderness on exam. -Unclear what the etiology is of her symptoms but could be gastroenteritis.  I would recommend certainly GI follow-up as an outpatient for colonoscopy for age-related reasons among others.  Do not think that this is clearly any sort of appendicitis. -If the disposition is to home, ER warnings for worsening of symptoms or failure to resolve.  I spent a total of 60 minutes in both face-to-face and non-face-to-face activities, excluding procedures performed, for this visit on the date of this encounter.   Marin Olp, MD [facility]

## 2022-10-06 NOTE — Discharge Instructions (Addendum)
Gracias por venir al [employer] [facility] [place]. Lo atendieron por dolor abdominal e hinchazn con nuseas. Le hicimos un examen, anlisis de laboratorio e imgenes, y no se observaron hallazgos agudos. Es probable que sus sntomas se deban a reflujo, indigestin o gastritis. Tome Pepcid 20 mg [employer]. Tambin puede usar Zofran 4 mg debajo de la lengua cada 6 a 8 horas segn sea necesario para las nuseas y los vmitos. Mantngase bien hidratado en casa. Realice un seguimiento con su mdico de atencin primaria dentro de 1 semana. Si no tiene un mdico de atencin primaria, puede obtener uno llamando a [facility]. Tambin puede visitar el sitio web de [place].  No dude en regresar al [facility] [facility] o llamar al 911 si experimenta: - Empeoramiento de los sntomas - Nuseas y vmitos tan intensos que no puede comer ni beber nada - Retail buyer en las heces - Dolor en el pecho, dificultad para respirar - Research scientist (life sciences), [employer] - Fiebre/escalofros - Cualquier otra cosa que le preocupe

## 2022-10-06 NOTE — ED Triage Notes (Signed)
PT states abdominal pain, headache and possible fever since Monday.  Pt c/o nausea and diarrhea.

## 2022-10-06 NOTE — ED Notes (Signed)
Pt ambulatory to bathroom

## 2022-10-06 NOTE — ED Provider Notes (Signed)
[place] [facility]MENT AT [facility] Provider Note   CSN: [id number] Arrival date & time: 10/06/22  1349     History  Chief Complaint  Patient presents with   Abdominal Pain    Danielle Campbell is a 47 y.o. female with history of cholecystitis, history utero vaginal prolapse, who presents with abdominal pain, headache and possible fever since Monday.  Pt c/o nausea and non-bloody diarrhea.  She states she had epigastric abdominal pain that has since resolved had a headache that started last night that it is also resolved.  She denies any sick contacts.  Now she states she just feels mildly nauseated and very bloated.  She says she cannot take a deep breath very well because she is so bloated.  Denies any urinary or vaginal symptoms.  Denies chest pain, shortness of breath, back pain/flank pain.  She states she has tried antacids which somewhat helped.  Feels dehydrated.  She felt somewhat similar in the past before she had her gallbladder removed.    Abdominal Pain      Home Medications Prior to Admission medications   Medication Sig Start Date End Date Taking? Authorizing Provider  famotidine (PEPCID) 20 MG tablet Take 1 tablet (20 mg total) by mouth 2 (two) times daily. 10/06/22  Yes Loetta Rough, MD  ondansetron (ZOFRAN-ODT) 4 MG disintegrating tablet Take 1 tablet (4 mg total) by mouth every 8 (eight) hours as needed for nausea or vomiting. 10/06/22  Yes Loetta Rough, MD  acetaminophen (TYLENOL) 500 MG tablet Take 1 tablet (500 mg total) by mouth every 6 (six) hours as needed (pain). 09/24/21   Marguerita Beards, MD  ibuprofen (ADVIL) 600 MG tablet Take 1 tablet (600 mg total) by mouth every 6 (six) hours as needed. 09/24/21   Marguerita Beards, MD  MACA ROOT PO Take by mouth daily.    [provider]  Magnesium Citrate 100 MG TABS Take by mouth every evening.    [provider]  Multiple Vitamins-Minerals (WOMENS  MULTIVITAMIN PO) Take by mouth.    [provider]  naproxen (NAPROSYN) 500 MG tablet Take 1 tablet (500 mg total) by mouth 2 (two) times daily. 10/11/21   Marita Kansas, PA-C  Potassium 95 MG TABS Take by mouth.    [provider]  VALERIAN ROOT PO Take by mouth every evening.    [provider]  vitamin B-12 (CYANOCOBALAMIN) 1000 MCG tablet Take 1,000 mcg by mouth daily.    [provider]      Allergies    Hydrocodone    Review of Systems   Review of Systems  Gastrointestinal:  Positive for abdominal pain.   A 10 point review of systems was performed and is negative unless otherwise reported in HPI.  Physical Exam Updated Vital Signs BP 111/70   Pulse 78   Temp 98.3 F (36.8 C) (Oral)   Resp 18   Ht 5\' 4"  (1.626 m)   Wt 85.3 kg   LMP 09/12/2021 (Approximate)   SpO2 98%   BMI 32.27 kg/m  Physical Exam General: Normal appearing female, lying in bed.  HEENT: Sclera anicteric, MMM, trachea midline.  Cardiology: RRR, no murmurs/rubs/gallops.  Resp: Normal respiratory rate and effort. CTAB, no wheezes, rhonchi, crackles.  Abd: Mild epigastric TTP. No RLQ/RUQ TTP. Soft, non-distended. No rebound tenderness or guarding.  GU: Deferred. MSK: No peripheral edema or signs of trauma. Extremities without deformity or TTP. No cyanosis or  clubbing. Skin: warm, dry. Neuro: A&Ox4, CNs II-XII grossly intact. MAEs. Sensation grossly intact.  Psych: Normal mood and affect.   ED Results / Procedures / Treatments   Labs (all labs ordered are listed, but only abnormal results are displayed) Labs Reviewed  COMPREHENSIVE METABOLIC PANEL - Abnormal; Notable for the following components:      Result Value   Potassium 3.4 (*)    Glucose, Bld 127 (*)    All other components within normal limits  CBC - Abnormal; Notable for the following components:   Hemoglobin 10.8 (*)    HCT 35.6 (*)    MCV 79.8 (*)    MCH 24.2 (*)    RDW 15.6 (*)    Platelets 438 (*)     All other components within normal limits  RESP PANEL BY RT-PCR (RSV, FLU A&B, COVID)  RVPGX2  LIPASE, BLOOD  URINALYSIS, ROUTINE W REFLEX MICROSCOPIC    EKG None  Radiology CT ABDOMEN PELVIS W CONTRAST  Result Date: 10/06/2022 CLINICAL DATA:  Abdominal pain and fever EXAM: CT ABDOMEN AND PELVIS WITH CONTRAST TECHNIQUE: Multidetector CT imaging of the abdomen and pelvis was performed using the standard protocol following bolus administration of intravenous contrast. RADIATION DOSE REDUCTION: This exam was performed according to the departmental dose-optimization program which includes automated exposure control, adjustment of the mA and/or kV according to patient size and/or use of iterative reconstruction technique. CONTRAST:  75mL OMNIPAQUE IOHEXOL 350 MG/ML SOLN COMPARISON:  CT abdomen and pelvis with contrast July tenth 2023 FINDINGS: Lower chest: The visualized bibasilar lungs clear. Normal heart size and no pericardial effusion. Hepatobiliary: No focal liver abnormality is seen. Status post cholecystectomy. No biliary dilatation. Pancreas: Unremarkable. No pancreatic ductal dilatation or surrounding inflammatory changes. Spleen: Normal in size without focal abnormality. Adrenals/Urinary Tract: Adrenal glands are unremarkable. Kidneys are normal, without renal calculi, focal lesion, or hydronephrosis. The bladder is distended but no bladder wall thickening or adjacent fat stranding. There is a single locule of air within the bladder. Stomach/Bowel: Stomach is within normal limits. The appendix is minimally dilated with minimal surrounding fat stranding. There is no evidence of appendicolith, extraluminal air, or adjacent fluid collection. Colonic diverticulosis without evidence of diverticulitis. No evidence of bowel wall thickening, distention, or inflammatory changes. Vascular/Lymphatic: No significant vascular findings are present. No enlarged abdominal or pelvic lymph nodes. Reproductive: The  uterus is not visualized.  No adnexal masses. Other: No abdominal wall hernia or abnormality. No abdominopelvic ascites. Musculoskeletal: No acute or significant osseous findings. IMPRESSION: 1. The appendix is minimally dilated with minimal surrounding fat stranding, which may represent early uncomplicated appendicitis. No evidence of appendicolith, extraluminal air, or adjacent fluid collections. 2. Colonic diverticulosis without evidence of diverticulitis. 3. There is a single locule of air within the bladder, of uncertain significance. The bladder is otherwise within normal limits. Recommend correlation with history of catheterization and urinalysis. Electronically Signed   By: Jacob Moores M.D.   On: 10/06/2022 17:16    Procedures Procedures    Medications Ordered in ED Medications  iohexol (OMNIPAQUE) 350 MG/ML injection 75 mL (75 mLs Intravenous Contrast Given 10/06/22 1635)    ED Course/ Medical Decision Making/ A&P                          Medical Decision Making Amount and/or Complexity of Data Reviewed Labs: ordered. Decision-making details documented in ED Course. Radiology: ordered. Decision-making details documented in ED Course.  Risk Prescription drug  management.    This patient presents to the ED for concern of abdominal pain, nausea, headache, bloating, this involves an extensive number of treatment options, and is a complaint that carries with it a high risk of complications and morbidity.  I considered the following differential and admission for this acute, potentially life threatening condition.   MDM:    For DDX for abdominal pain includes but is not limited to:  Abdominal exam without peritoneal signs. No evidence of acute abdomen at this time.   I feel that the greatest concern for this patient is a systemic viral syndrome/gastroenteritis given her constellation of nausea abdominal bloating diarrhea fever and headache.  Also consider SBO with diarrhea  bloating nausea; low concern for cholecystitis/cholangitis given patient is status post cholecystectomy; consider pancreatitis, PUD/gastritis as patient states that antacids have somewhat helped her; low concern for acute appendicitis, vascular catastrophe, bowel obstruction, viscus perforation, or diverticulitis.    Clinical Course as of 10/06/22 1925  Thu Oct 06, 2022  1433 Patient not in room for evaluation. [BM]  1522 Resp panel by RT-PCR (RSV, Flu A&B, Covid) Anterior Nasal Swab neg [HN]  1522 Hemoglobin(!): 10.8 C/w prior anemia [HN]  1537 Comprehensive metabolic panel(!) Unremarkable in the context of this patient's presentation  [HN]  1537 Lipase: 40 neg [HN]  1735 CT ABDOMEN PELVIS W CONTRAST 1. The appendix is minimally dilated with minimal surrounding fat stranding, which may represent early uncomplicated appendicitis. No evidence of appendicolith, extraluminal air, or adjacent fluid collections. 2. Colonic diverticulosis without evidence of diverticulitis. 3. There is a single locule of air within the bladder, of uncertain significance. The bladder is otherwise within normal limits. Recommend correlation with history of catheterization and urinalysis.   [HN]  [id number] Surgery came to see patient, who does not feel clinically she has appendicitis. No RLQ pain to palpation, no white count. UA is negative as well. Patient likely w/ GERD/indigestion symptoms. Will treat w/ pepcid at home and DC w/ PCP f/u.  [HN]    Clinical Course User Index [BM] Bill Salinas, PA-C [HN] Loetta Rough, MD    Labs: I Ordered, and personally interpreted labs.  The pertinent results include: Those listed above  Imaging Studies ordered: I ordered imaging studies including CT abd pelvis w contrast I independently visualized and interpreted imaging. I agree with the radiologist interpretation  Additional history obtained from chart reivew, husband at bedside.    Reevaluation: After the  interventions noted above, I reevaluated the patient and found that they have :stayed the same  Social Determinants of Health: Patient lives independently   Disposition:  DC w/ discharge instructions/return precautions. All questions answered to patient's satisfaction.    Co morbidities that complicate the patient evaluation  Past Medical History:  Diagnosis Date   Dysmenorrhea 2018   GERD (gastroesophageal reflux disease)    Positive TB test    normal chest x-ray on 06/19/2020, as of 10/07/21, pt states she has never been sick with TB or had any symptoms   Uterus prolapse    Wears glasses      Medicines Meds ordered this encounter  Medications   iohexol (OMNIPAQUE) 350 MG/ML injection 75 mL   famotidine (PEPCID) 20 MG tablet    Sig: Take 1 tablet (20 mg total) by mouth 2 (two) times daily.    Dispense:  30 tablet    Refill:  0   ondansetron (ZOFRAN-ODT) 4 MG disintegrating tablet    Sig: Take 1 tablet (4 mg  total) by mouth every 8 (eight) hours as needed for nausea or vomiting.    Dispense:  20 tablet    Refill:  0    I have reviewed the patients home medicines and have made adjustments as needed  Problem List / ED Course: Problem List Items Addressed This Visit   None Visit Diagnoses     Gastritis without bleeding, unspecified chronicity, unspecified gastritis type    -  Primary                   This note was created using dictation software, which may contain spelling or grammatical errors.    Loetta Rough, MD 10/06/22 [phone]

## 2023-06-18 IMAGING — MG MM DIGITAL DIAGNOSTIC UNILAT*R* W/ TOMO W/ CAD
4 series · 4 of 12 positions shown · non-contrast
Comparison: Previous exam(s).

CLINICAL DATA: Screening recall for a possible right breast mass.

EXAM:
DIGITAL DIAGNOSTIC UNILATERAL RIGHT MAMMOGRAM WITH TOMOSYNTHESIS AND
CAD; ULTRASOUND RIGHT BREAST LIMITED
TECHNIQUE: Right digital diagnostic mammography and breast tomosynthesis was
performed. The images were evaluated with computer-aided detection.;
Targeted ultrasound examination of the right breast was performed

[R ML synth-2D]
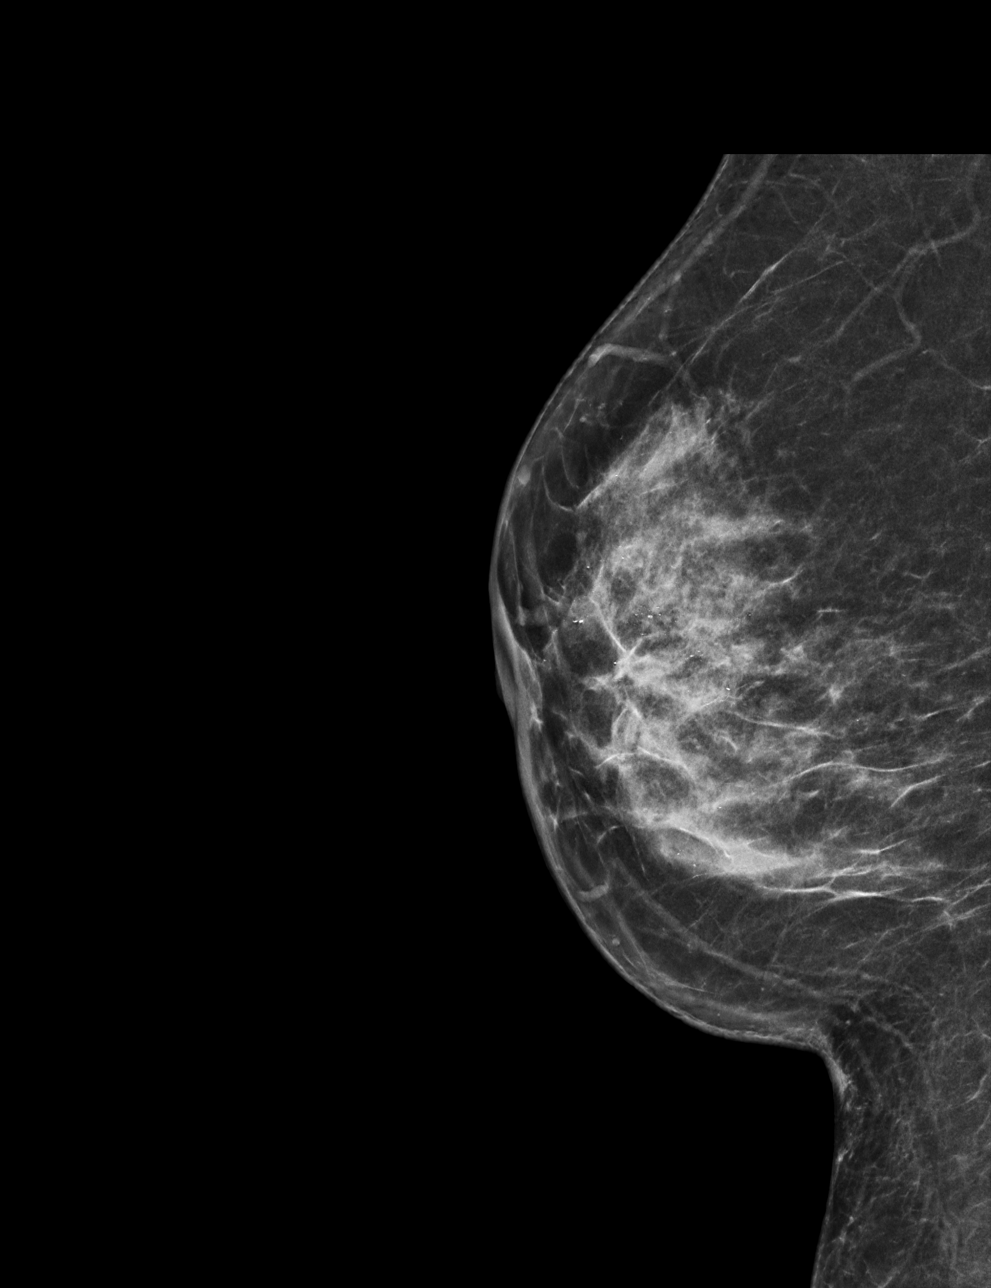

[R MLO synth-2D]
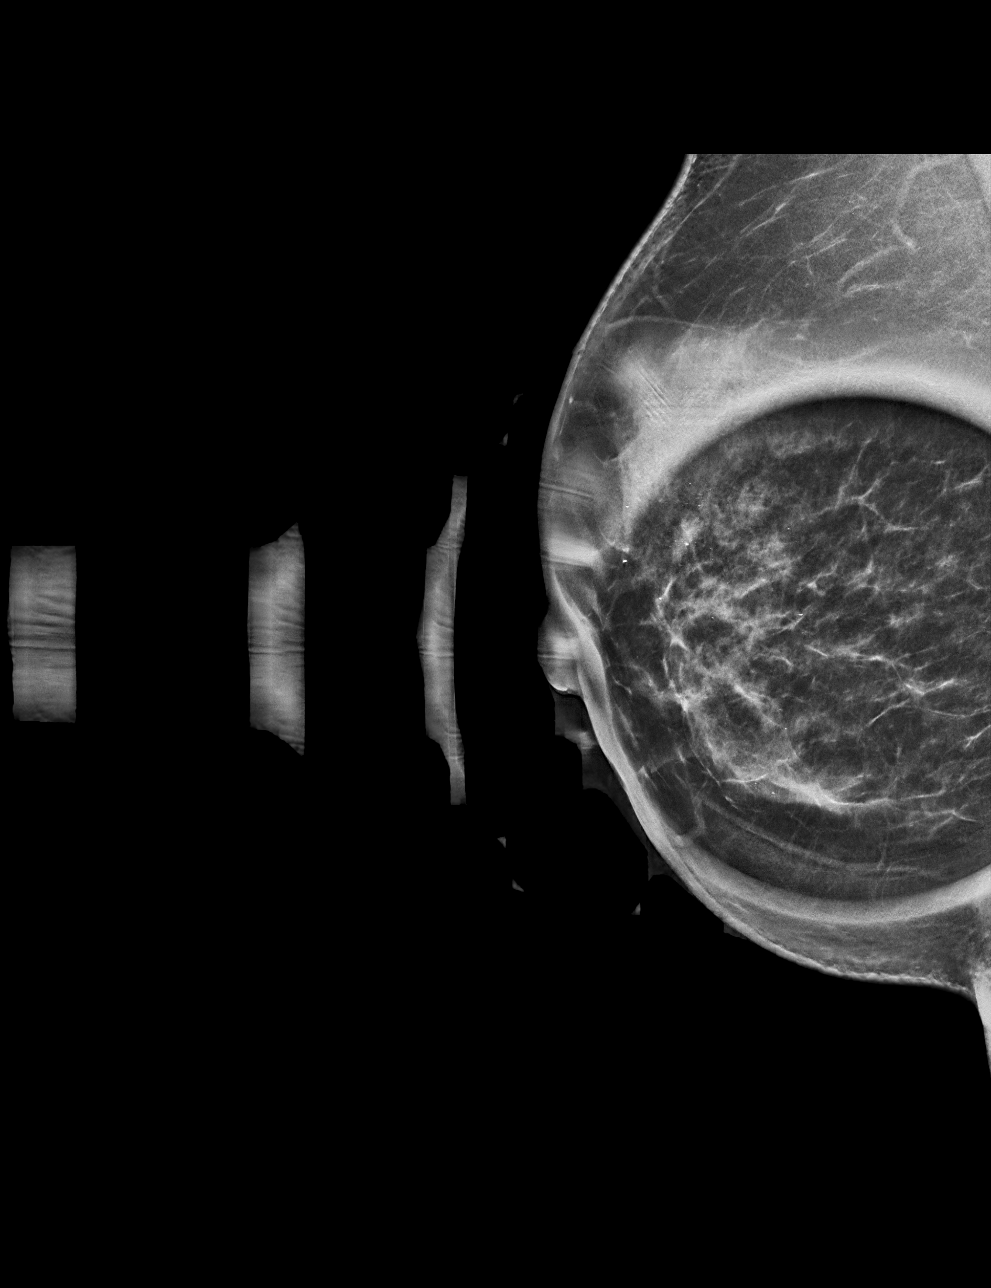

[R ML tomo · tomo slice 31/61.0]
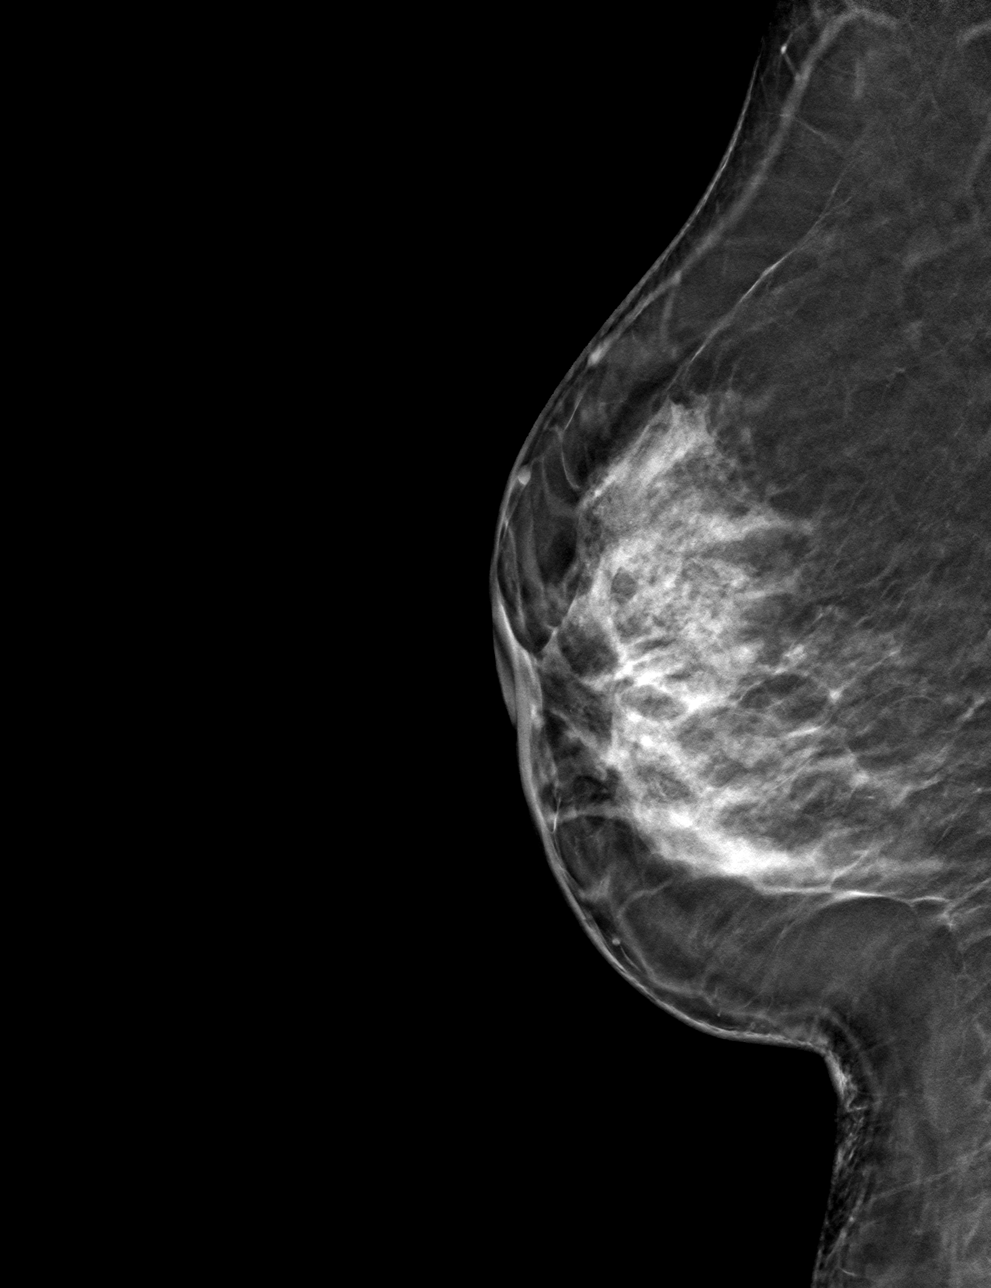

[R MLO tomo · tomo slice 30/59.0]
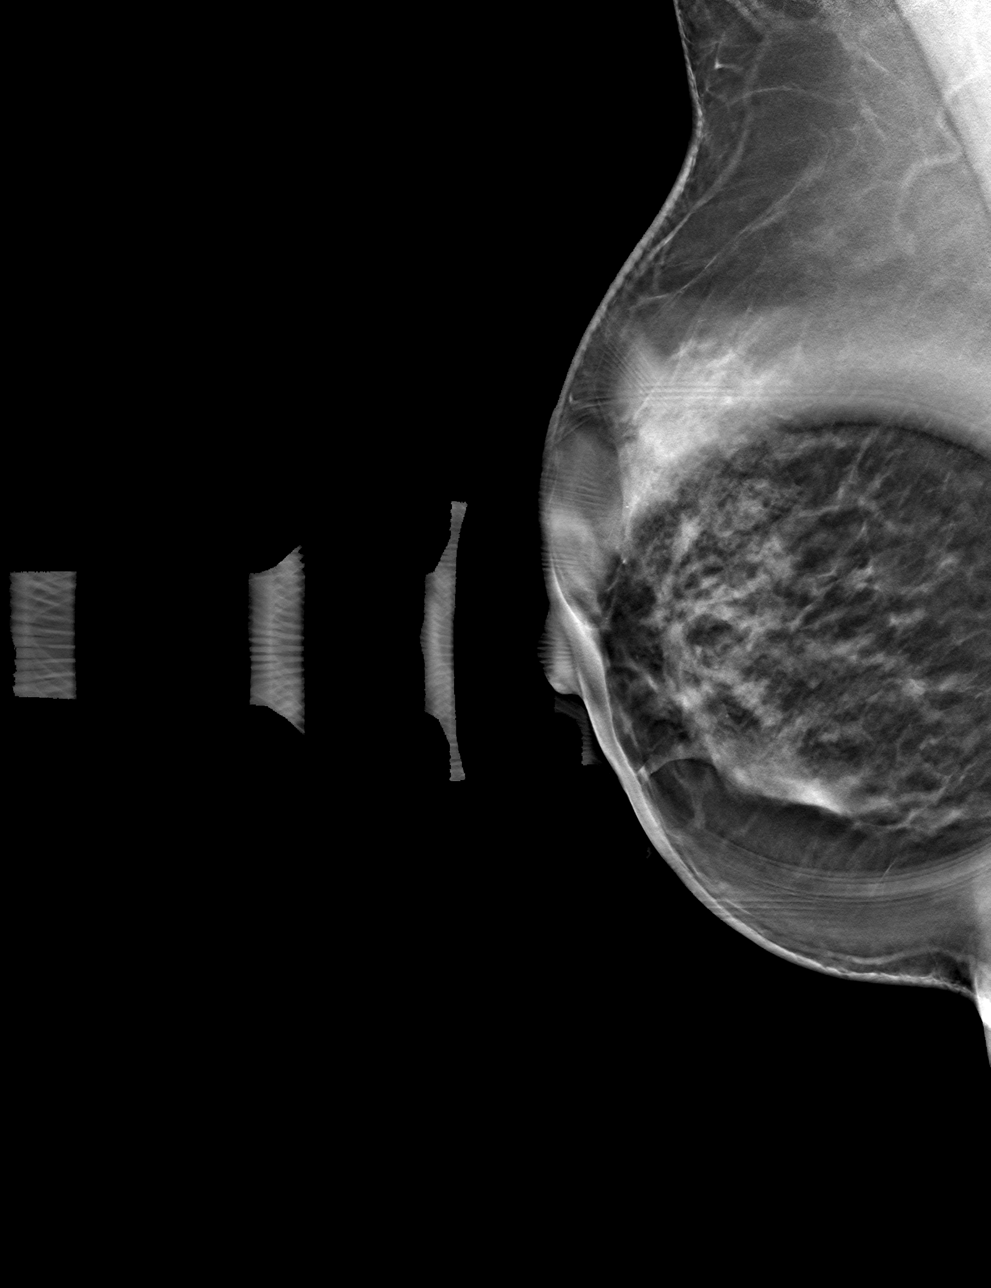

[4 of 12 positions shown; findings below may reference images not displayed]

ACR Breast Density Category c: The breast tissue is heterogeneously
dense, which may obscure small masses.
FINDINGS: The mass in the lower outer quadrant of the right breast, middle
depth is not discretely seen on the spot compression tomosynthesis
images or on the full paddle true lateral tomosynthesis images.
However, due to the density of the surrounding breast tissue,
ultrasound will be performed for further evaluation.

Ultrasound of the right breast at [DATE], 1 cm from the nipple
demonstrates a benign appearing intramammary lymph node measuring 5
mm.

In the retroareolar right breast at 9 o'clock, there is a bilobed
hypoechoic mass with indistinct margins measuring 1.1 x 0.6 x
cm.

Ultrasound of the right axilla demonstrates multiple
normal-appearing lymph nodes.
IMPRESSION: 1. There is an indeterminate 1.1 cm mass in the retroareolar right
breast at 9 o'clock.

2.  No evidence of right axillary lymphadenopathy.

RECOMMENDATION:
Ultrasound guided biopsy is recommended for the right breast mass at
9 o'clock. The procedure has been scheduled for 01/19/2021 at [DATE]
a.m.

I have discussed the findings and recommendations with the patient.
If applicable, a reminder letter will be sent to the patient
regarding the next appointment.

BI-RADS CATEGORY  4: Suspicious.

## 2023-06-18 IMAGING — US US BREAST*R* LIMITED INC AXILLA
1 series · 13 of 16 positions shown · non-contrast
Comparison: Previous exam(s).

CLINICAL DATA: Screening recall for a possible right breast mass.

EXAM:
DIGITAL DIAGNOSTIC UNILATERAL RIGHT MAMMOGRAM WITH TOMOSYNTHESIS AND
CAD; ULTRASOUND RIGHT BREAST LIMITED
TECHNIQUE: Right digital diagnostic mammography and breast tomosynthesis was
performed. The images were evaluated with computer-aided detection.;
Targeted ultrasound examination of the right breast was performed

[Series 1: us breast*right* limited inc axilla · 0.05mm/px · 13 of 16 slices shown]
[im 1/16]
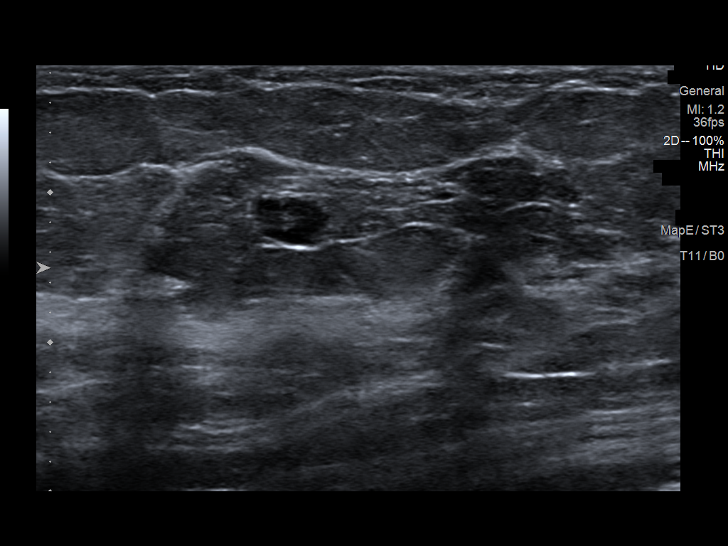
[im 2/16]
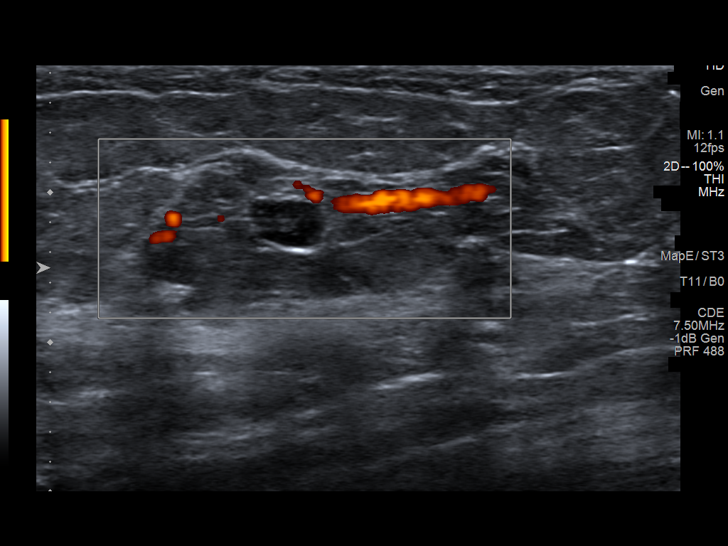
[im 4/16]
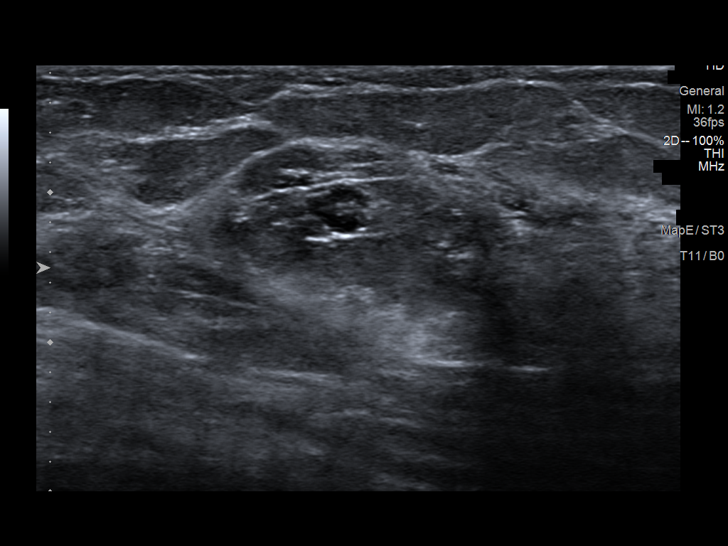
[im 5/16]
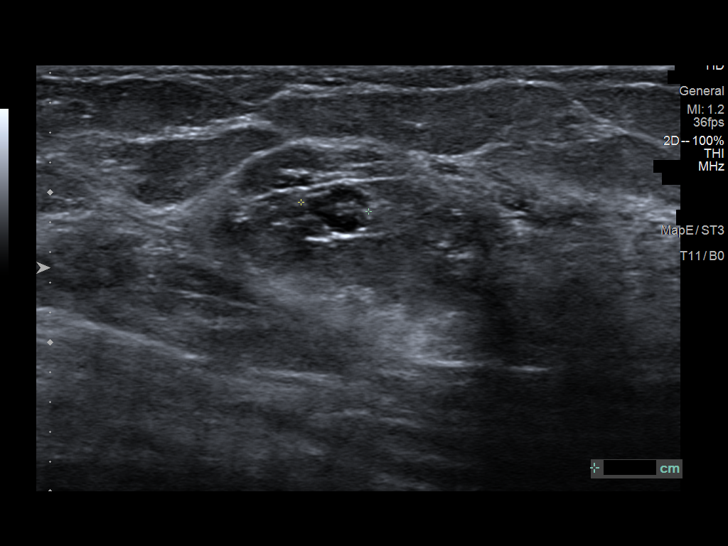
[im 6/16]
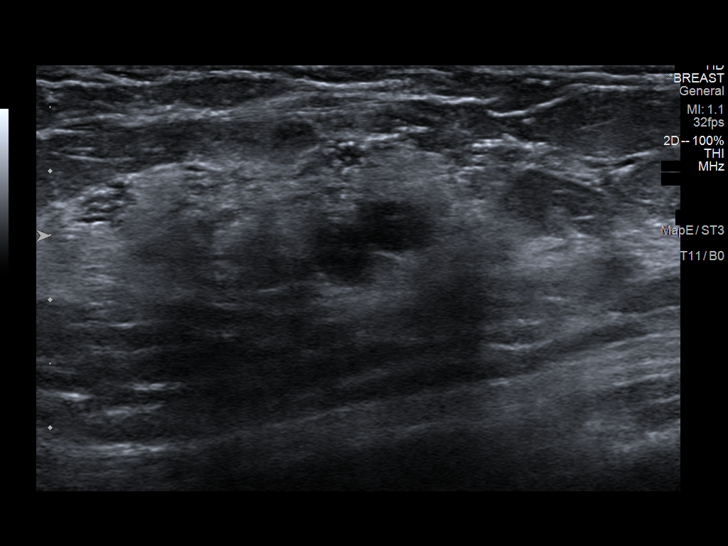
[im 7/16]
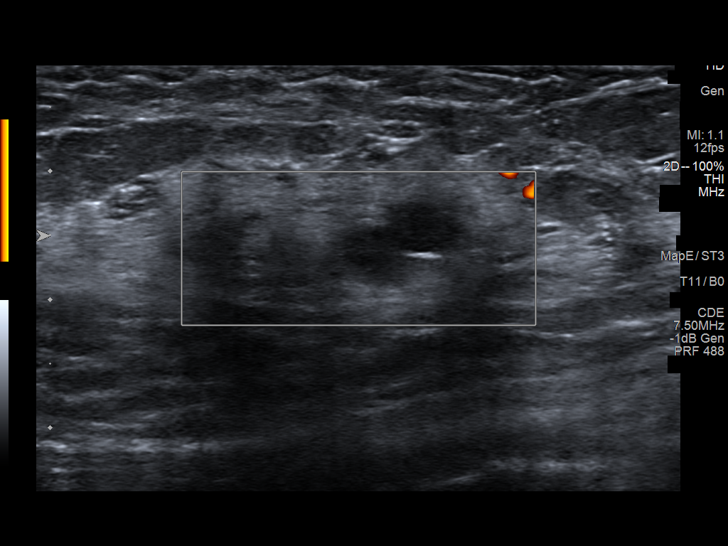
[im 9/16]
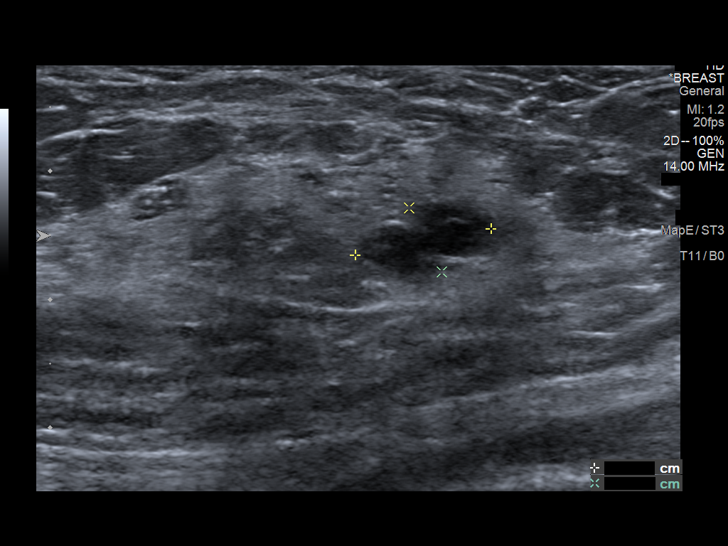
[im 10/16]
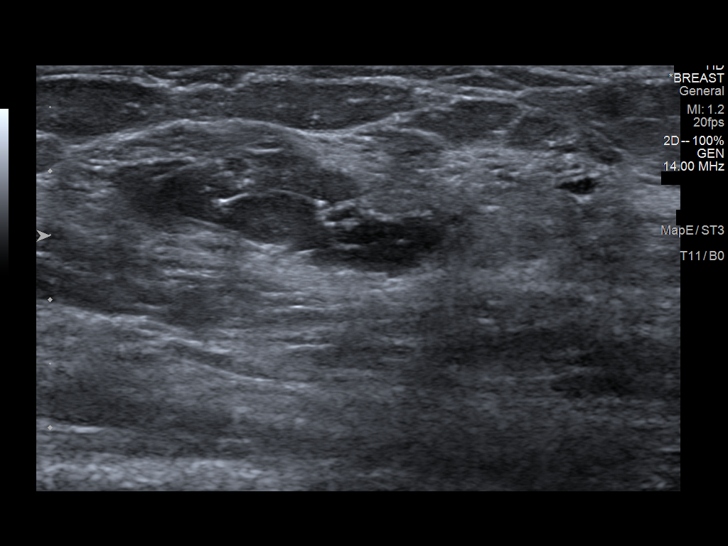
[im 11/16]
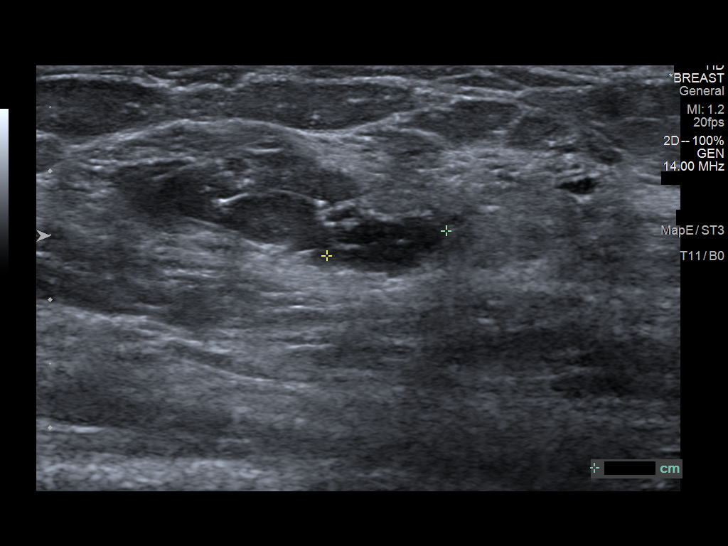
[im 12/16]
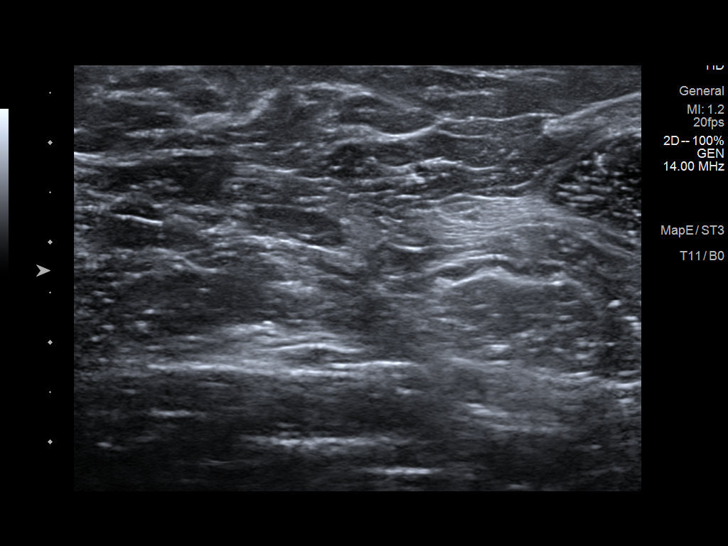
[im 13/16]
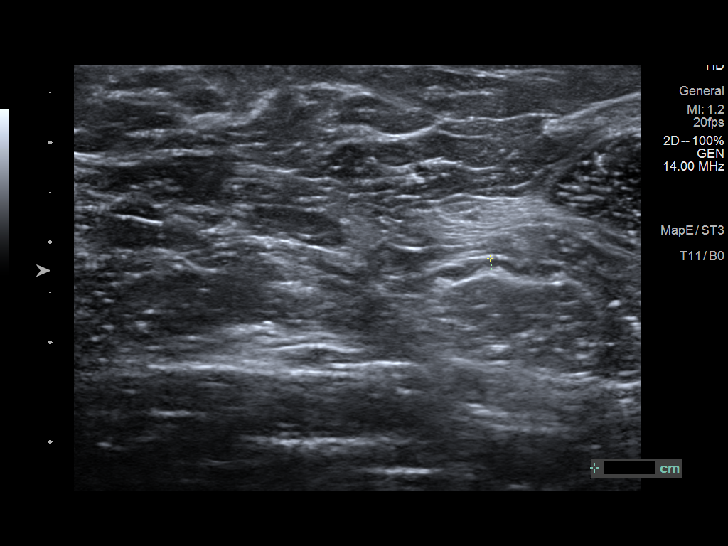
[im 15/16]
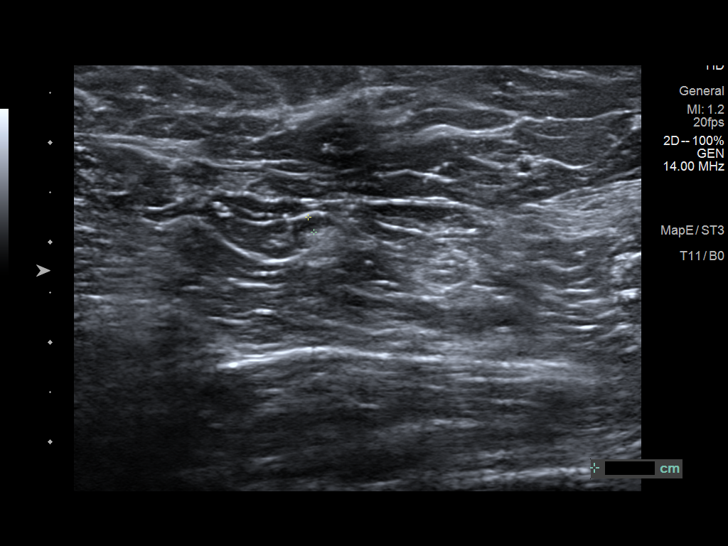
[im 16/16]
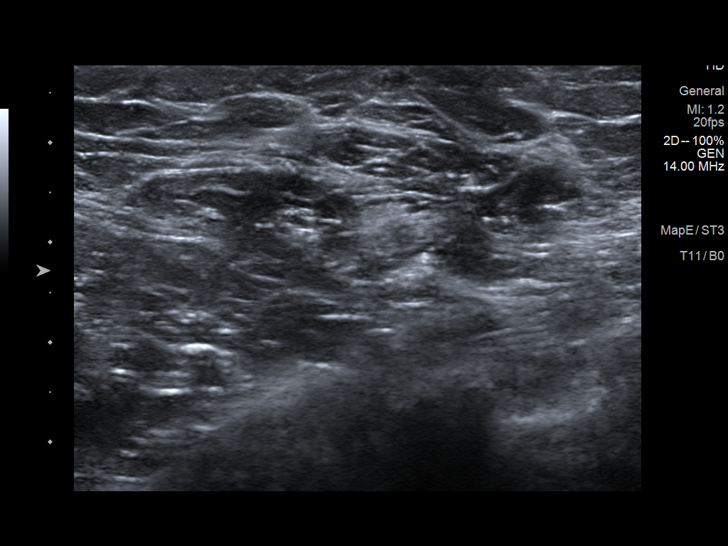

[13 of 16 positions shown; findings below may reference images not displayed]

ACR Breast Density Category c: The breast tissue is heterogeneously
dense, which may obscure small masses.
FINDINGS: The mass in the lower outer quadrant of the right breast, middle
depth is not discretely seen on the spot compression tomosynthesis
images or on the full paddle true lateral tomosynthesis images.
However, due to the density of the surrounding breast tissue,
ultrasound will be performed for further evaluation.

Ultrasound of the right breast at [DATE], 1 cm from the nipple
demonstrates a benign appearing intramammary lymph node measuring 5
mm.

In the retroareolar right breast at 9 o'clock, there is a bilobed
hypoechoic mass with indistinct margins measuring 1.1 x 0.6 x
cm.

Ultrasound of the right axilla demonstrates multiple
normal-appearing lymph nodes.
IMPRESSION: 1. There is an indeterminate 1.1 cm mass in the retroareolar right
breast at 9 o'clock.

2.  No evidence of right axillary lymphadenopathy.

RECOMMENDATION:
Ultrasound guided biopsy is recommended for the right breast mass at
9 o'clock. The procedure has been scheduled for 01/19/2021 at [DATE]
a.m.

I have discussed the findings and recommendations with the patient.
If applicable, a reminder letter will be sent to the patient
regarding the next appointment.

BI-RADS CATEGORY  4: Suspicious.

## 2023-08-01 ENCOUNTER — Encounter (HOSPITAL_COMMUNITY): Payer: Self-pay | Admitting: Emergency Medicine

## 2023-08-01 ENCOUNTER — Observation Stay (HOSPITAL_COMMUNITY)
Admission: EM | Admit: 2023-08-01 | Discharge: 2023-08-02 | Disposition: A | Discharge status: Home / Self Care | Payer: Self-pay | Attending: Surgery | Admitting: Surgery

## 2023-08-01 ENCOUNTER — Other Ambulatory Visit: Payer: Self-pay

## 2023-08-01 ENCOUNTER — Emergency Department (HOSPITAL_COMMUNITY): Payer: Self-pay

## 2023-08-01 DIAGNOSIS — R112 Nausea with vomiting, unspecified: Secondary | ICD-10-CM

## 2023-08-01 DIAGNOSIS — Z6832 Body mass index (BMI) 32.0-32.9, adult: Secondary | ICD-10-CM | POA: Insufficient documentation

## 2023-08-01 DIAGNOSIS — K219 Gastro-esophageal reflux disease without esophagitis: Secondary | ICD-10-CM | POA: Insufficient documentation

## 2023-08-01 DIAGNOSIS — R109 Unspecified abdominal pain: Secondary | ICD-10-CM

## 2023-08-01 DIAGNOSIS — K3580 Unspecified acute appendicitis: Principal | ICD-10-CM | POA: Insufficient documentation

## 2023-08-01 DIAGNOSIS — E669 Obesity, unspecified: Secondary | ICD-10-CM | POA: Insufficient documentation

## 2023-08-01 LAB — URINALYSIS, ROUTINE W REFLEX MICROSCOPIC
Bilirubin Urine: NEGATIVE
Glucose, UA: NEGATIVE mg/dL
Hgb urine dipstick: NEGATIVE
Ketones, ur: NEGATIVE mg/dL
Leukocytes,Ua: NEGATIVE
Nitrite: NEGATIVE
Protein, ur: NEGATIVE mg/dL
Specific Gravity, Urine: 1.004 — ABNORMAL LOW (ref 1.005–1.030)
pH: 7 (ref 5.0–8.0)

## 2023-08-01 LAB — CBC
HCT: 34.5 % — ABNORMAL LOW (ref 36.0–46.0)
Hemoglobin: 10.8 g/dL — ABNORMAL LOW (ref 12.0–15.0)
MCH: 25.6 pg — ABNORMAL LOW (ref 26.0–34.0)
MCHC: 31.3 g/dL (ref 30.0–36.0)
MCV: 81.8 fL (ref 80.0–100.0)
Platelets: 378 10*3/uL (ref 150–400)
RBC: 4.22 MIL/uL (ref 3.87–5.11)
RDW: 15.1 % (ref 11.5–15.5)
WBC: 14.7 10*3/uL — ABNORMAL HIGH (ref 4.0–10.5)
nRBC: 0 % (ref 0.0–0.2)

## 2023-08-01 LAB — COMPREHENSIVE METABOLIC PANEL WITH GFR
ALT: 26 U/L (ref 0–44)
AST: 34 U/L (ref 15–41)
Albumin: 3.8 g/dL (ref 3.5–5.0)
Alkaline Phosphatase: 94 U/L (ref 38–126)
Anion gap: 11 (ref 5–15)
BUN: 11 mg/dL (ref 6–20)
CO2: 21 mmol/L — ABNORMAL LOW (ref 22–32)
Calcium: 9.1 mg/dL (ref 8.9–10.3)
Chloride: 101 mmol/L (ref 98–111)
Creatinine, Ser: 0.7 mg/dL (ref 0.44–1.00)
GFR, Estimated: >60 mL/min (ref >60)
Glucose, Bld: 170 mg/dL — ABNORMAL HIGH (ref 70–99)
Potassium: 3.8 mmol/L (ref 3.5–5.1)
Sodium: 133 mmol/L — ABNORMAL LOW (ref 135–145)
Total Bilirubin: 0.7 mg/dL (ref 0.0–1.2)
Total Protein: 8.1 g/dL (ref 6.5–8.1)

## 2023-08-01 LAB — LIPASE, BLOOD: Lipase: 29 U/L (ref 11–51)

## 2023-08-01 MED ORDER — MORPHINE SULFATE (PF) 4 MG/ML IV SOLN
4.0000 mg | Freq: Once | INTRAVENOUS | Status: AC
  Administered 2023-08-01: 4 mg via INTRAVENOUS
  Filled 2023-08-01: qty 1

## 2023-08-01 MED ORDER — IOHEXOL 350 MG/ML SOLN
75.0000 mL | Freq: Once | INTRAVENOUS | Status: AC | PRN
  Administered 2023-08-01: 75 mL via INTRAVENOUS

## 2023-08-01 NOTE — Discharge Instructions (Signed)
 Thank you for let us  evaluate you today.  Your imaging was negative for intra-abdominal pathology nor gynecological reason for symptoms.  Your symptoms are likely secondary to an infection.  Please make sure to drink plenty of fluids.  If you do not eat that is okay as long as you are drinking (Gatorade, Pedialyte, chicken broth).  I have sent a prescription for Zofran  for nausea  Return to Emergency Department if you experience significant worsening of symptoms, intractable vomiting

## 2023-08-01 NOTE — ED Provider Notes (Addendum)
 [place] [facility] Provider Note   CSN: [id number] Arrival date & time: 08/01/23  1625     History  Chief Complaint  Patient presents with   Abdominal Pain   Emesis    Danielle Campbell Danielle Campbell is a 48 y.o. female with a past medical history of GERD, cholecystectomy, hysterectomy presents to emergency department for evaluation of chills, abdominal pain, 1 episode of vomiting, and 1 episode of diarrhea this morning.  She reports that the abdominal pain is "stabbing" and constant located in epigastric and RLQ regions.  Since this morning, pain has decreased from 10/10 to 8/10.  She is not currently nauseous. Has not had any suspicious foods. She denies urinary symptoms, vaginal symptoms, known fevers, known sick contacts.  The history is provided by the patient. The history is limited by a language barrier. A language interpreter was used.  Abdominal Pain Associated symptoms: vomiting   Emesis Associated symptoms: abdominal pain       Home Medications Prior to Admission medications   Medication Sig Start Date End Date Taking? Authorizing Provider  acetaminophen  (TYLENOL ) 500 MG tablet Take 1 tablet (500 mg total) by mouth every 6 (six) hours as needed (pain). 09/24/21   Arma Lamp, MD  famotidine  (PEPCID ) 20 MG tablet Take 1 tablet (20 mg total) by mouth 2 (two) times daily. 10/06/22   Merdis Stalling, MD  ibuprofen  (ADVIL ) 600 MG tablet Take 1 tablet (600 mg total) by mouth every 6 (six) hours as needed. 09/24/21   Arma Lamp, MD  MACA ROOT PO Take by mouth daily.    [provider]  Magnesium Citrate 100 MG TABS Take by mouth every evening.    [provider]  Multiple Vitamins-Minerals (WOMENS MULTIVITAMIN PO) Take by mouth.    [provider]  naproxen  (NAPROSYN ) 500 MG tablet Take 1 tablet (500 mg total) by mouth 2 (two) times daily. 10/11/21   Ceclia Cohens, Amjad, PA-C  ondansetron  (ZOFRAN -ODT) 4  MG disintegrating tablet Take 1 tablet (4 mg total) by mouth every 8 (eight) hours as needed for nausea or vomiting. 10/06/22   Merdis Stalling, MD  Potassium 95 MG TABS Take by mouth.    [provider]  VALERIAN ROOT PO Take by mouth every evening.    [provider]  vitamin B-12 (CYANOCOBALAMIN) 1000 MCG tablet Take 1,000 mcg by mouth daily.    [provider]      Allergies    Hydrocodone     Review of Systems   Review of Systems  Gastrointestinal:  Positive for abdominal pain and vomiting.    Physical Exam Updated Vital Signs BP 121/68 (BP Location: Left Arm)   Pulse 89   Temp 98.4 F (36.9 C)   Resp 17   Wt 85.3 kg   LMP 09/12/2021 (Approximate)   SpO2 99%   BMI 32.28 kg/m  Physical Exam Vitals and nursing note reviewed.  Constitutional:      General: She is not in acute distress.    Appearance: Normal appearance. She is not ill-appearing.  HENT:     Head: Normocephalic and atraumatic.  Eyes:     Conjunctiva/sclera: Conjunctivae normal.  Cardiovascular:     Rate and Rhythm: Normal rate.  Pulmonary:     Effort: Pulmonary effort is normal. No respiratory distress.  Abdominal:     General: Bowel sounds are normal.     Tenderness: There is abdominal tenderness in the right lower  quadrant and epigastric area. There is no right CVA tenderness, left CVA tenderness, guarding or rebound. Negative signs include Rovsing's sign.    Musculoskeletal:     Right lower leg: No edema.     Left lower leg: No edema.  Skin:    Coloration: Skin is not jaundiced or pale.  Neurological:     Mental Status: She is alert and oriented to person, place, and time. Mental status is at baseline.    ED Results / Procedures / Treatments   Labs (all labs ordered are listed, but only abnormal results are displayed) Labs Reviewed  COMPREHENSIVE METABOLIC PANEL WITH GFR - Abnormal; Notable for the following components:      Result Value   Sodium 133 (*)    CO2 21  (*)    Glucose, Bld 170 (*)    All other components within normal limits  CBC - Abnormal; Notable for the following components:   WBC 14.7 (*)    Hemoglobin 10.8 (*)    HCT 34.5 (*)    MCH 25.6 (*)    All other components within normal limits  URINALYSIS, ROUTINE W REFLEX MICROSCOPIC - Abnormal; Notable for the following components:   Specific Gravity, Urine 1.004 (*)    All other components within normal limits  LIPASE, BLOOD  PREGNANCY, URINE    EKG None  Radiology CT ABDOMEN PELVIS W CONTRAST Result Date: 08/01/2023 CLINICAL DATA:  RLQ abdominal pain ANDepigastric pain. Pt complains of chills, abd pain, vomiting and diarrhea this morning. EXAM: CT ABDOMEN AND PELVIS WITH CONTRAST TECHNIQUE: Multidetector CT imaging of the abdomen and pelvis was performed using the standard protocol following bolus administration of intravenous contrast. RADIATION DOSE REDUCTION: This exam was performed according to the departmental dose-optimization program which includes automated exposure control, adjustment of the mA and/or kV according to patient size and/or use of iterative reconstruction technique. CONTRAST:  75mL OMNIPAQUE  IOHEXOL  350 MG/ML SOLN COMPARISON:  CT abdomen pelvis 10/06/2022 FINDINGS: Lower chest: No acute abnormality. Hepatobiliary: No focal liver abnormality. Status post cholecystectomy. No biliary dilatation. Pancreas: No focal lesion. Normal pancreatic contour. No surrounding inflammatory changes. No main pancreatic ductal dilatation. Spleen: Normal in size without focal abnormality. Adrenals/Urinary Tract: No adrenal nodule bilaterally. Bilateral kidneys enhance symmetrically. No hydronephrosis. No hydroureter. The urinary bladder is unremarkable. Stomach/Bowel: Stomach is within normal limits. No evidence of bowel wall thickening or dilatation. Colonic diverticulosis. The distal appendiceal lumen is dilated measuring up to 1.3 cm with associated appendiceal wall thickening and  periappendiceal fat stranding. No appendicolith. Vascular/Lymphatic: No abdominal aorta or iliac aneurysm. Trace noncalcified atherosclerotic plaque. No abdominal, pelvic, or inguinal lymphadenopathy. Reproductive: Status post hysterectomy. No adnexal masses. Other: No intraperitoneal free fluid. No intraperitoneal free gas. No organized fluid collection. Musculoskeletal: Small fat containing umbilical hernia. No suspicious lytic or blastic osseous lesions. No acute displaced fracture. IMPRESSION: 1. Non-perforated acute tip appendicitis.  No appendicolith. 2. Colonic diverticulosis with no acute diverticulitis. 3.  Aortic Atherosclerosis (ICD10-I70.0). Electronically Signed   By: Morgane  Naveau M.D.   On: 08/01/2023 23:50    Procedures Procedures    Medications Ordered in ED Medications  cefTRIAXone  (ROCEPHIN ) 2 g in sodium chloride  0.9 % 100 mL IVPB (has no administration in time range)    And  metroNIDAZOLE  (FLAGYL ) IVPB 500 mg (has no administration in time range)  morphine  (PF) 4 MG/ML injection 4 mg (4 mg Intravenous Given 08/01/23 2254)  iohexol  (OMNIPAQUE ) 350 MG/ML injection 75 mL (75 mLs Intravenous Contrast Given 08/01/23  2334)    ED Course/ Medical Decision Making/ A&P                                 Medical Decision Making Amount and/or Complexity of Data Reviewed Labs: ordered. Radiology: ordered.  Risk Prescription drug management. Decision regarding hospitalization.   Patient presents to the ED for concern of abd pain, NVD, this involves an extensive number of treatment options, and is a complaint that carries with it a high risk of complications and morbidity.  The differential diagnosis includes gastroenteritis, perforation, obstruction, gastroparesis, viral infection, food poisoning, flu, COVID, RSV, appendicitis, IBS, IBD, ovarian pathology, torsion.  Not exhaustive list   Co morbidities that complicate the patient evaluation  See HPI   Additional history  obtained:  Additional history obtained from Nursing   External records from outside source obtained and reviewed including triage RN note   Lab Tests:  I Ordered, and personally interpreted labs.  The pertinent results include:   Mild hyponatremia 133 Glucose 170 UA without infection Lipase WNL Leukocytosis of 14.7 Hemoglobin 10.8 (was 10.89 months ago.  Baseline 10-12.8 over past 9 years)   Imaging Studies ordered:  I ordered imaging studies including CT abd pelvis w contrast which showed acute tip appendicitis I agree with radiologist interpretation   Consultations obtained:  I consulted general surgery Dr. Hassell Linsey and discussed ED workup, pertinent plan.  Will see patient and admit for appendectomy    Medicines ordered and prescription drug management:  I ordered medication including morphine   for pain  Reevaluation of the patient after these medicines showed that the patient improved I have reviewed the patients home medicines and have made adjustments as needed    Problem List / ED Course:  Abd pain NVD Provided morphine  for pain.   She currently denies nausea.  No vomiting while in ED Labs notable for leukocytosis of 14.7 Vital signs WNL and hemodynamically stable with no tachycardia nor fever CT shows uncomplicated appendicitis. Started patient on rocephin  and flagyl  Will consult general surgery   Reevaluation:  After the interventions noted above, I reevaluated the patient and found that they have :improved     Dispostion:  After considering diagnostic imaging, ED workup, I feel the patient would benefit from admission and surgery for appendicitis.  I discussed ED workup, plan, dispo with patient expresses understanding agrees with plan.  Disposition and plan discussed with use of interpreter  Final Clinical Impression(s) / ED Diagnoses Final diagnoses:  Nausea vomiting and diarrhea  Abdominal pain, unspecified abdominal location   Acute appendicitis, unspecified acute appendicitis type    Rx / DC Orders ED Discharge Orders     None         Royann Cords, PA 08/01/23 2325    Royann Cords, PA 08/02/23 1300    Rafael Bun A, DO 08/07/23 1148

## 2023-08-01 NOTE — ED Notes (Signed)
 ED Provider at bedside.

## 2023-08-01 NOTE — ED Triage Notes (Signed)
 Pt complains of chills,  abd pain, vomiting and diarrhea this morning.

## 2023-08-02 ENCOUNTER — Observation Stay (HOSPITAL_COMMUNITY): Payer: Self-pay | Admitting: Anesthesiology

## 2023-08-02 ENCOUNTER — Encounter (HOSPITAL_COMMUNITY): Payer: Self-pay

## 2023-08-02 ENCOUNTER — Encounter (HOSPITAL_COMMUNITY)
Admission: EM | Disposition: A | Discharge status: Home / Self Care | Payer: Self-pay | Source: Home / Self Care | Attending: Emergency Medicine

## 2023-08-02 ENCOUNTER — Other Ambulatory Visit: Payer: Self-pay

## 2023-08-02 DIAGNOSIS — K37 Unspecified appendicitis: Secondary | ICD-10-CM

## 2023-08-02 DIAGNOSIS — K3580 Unspecified acute appendicitis: Secondary | ICD-10-CM | POA: Diagnosis present

## 2023-08-02 HISTORY — PX: LAPAROSCOPIC APPENDECTOMY: SHX408

## 2023-08-02 LAB — CBC
HCT: 35.1 % — ABNORMAL LOW (ref 36.0–46.0)
Hemoglobin: 10.9 g/dL — ABNORMAL LOW (ref 12.0–15.0)
MCH: 26 pg (ref 26.0–34.0)
MCHC: 31.1 g/dL (ref 30.0–36.0)
MCV: 83.8 fL (ref 80.0–100.0)
Platelets: 344 10*3/uL (ref 150–400)
RBC: 4.19 MIL/uL (ref 3.87–5.11)
RDW: 15.6 % — ABNORMAL HIGH (ref 11.5–15.5)
WBC: 10.6 10*3/uL — ABNORMAL HIGH (ref 4.0–10.5)
nRBC: 0 % (ref 0.0–0.2)

## 2023-08-02 LAB — BASIC METABOLIC PANEL WITH GFR
Anion gap: 12 (ref 5–15)
BUN: 8 mg/dL (ref 6–20)
CO2: 19 mmol/L — ABNORMAL LOW (ref 22–32)
Calcium: 9 mg/dL (ref 8.9–10.3)
Chloride: 105 mmol/L (ref 98–111)
Creatinine, Ser: 0.7 mg/dL (ref 0.44–1.00)
GFR, Estimated: >60 mL/min (ref >60)
Glucose, Bld: 96 mg/dL (ref 70–99)
Potassium: 3.9 mmol/L (ref 3.5–5.1)
Sodium: 136 mmol/L (ref 135–145)

## 2023-08-02 LAB — HIV ANTIBODY (ROUTINE TESTING W REFLEX): HIV Screen 4th Generation wRfx: NONREACTIVE

## 2023-08-02 LAB — PREGNANCY, URINE: Preg Test, Ur: NEGATIVE

## 2023-08-02 SURGERY — APPENDECTOMY, LAPAROSCOPIC
Anesthesia: General

## 2023-08-02 MED ORDER — METHOCARBAMOL 1000 MG/10ML IJ SOLN: 500.0000 mg | Freq: Three times a day (TID) | INTRAMUSCULAR | Status: DC | PRN

## 2023-08-02 MED ORDER — ACETAMINOPHEN 500 MG PO TABS
ORAL_TABLET | ORAL | Status: AC
Start: 2023-08-02 — End: 2023-08-02
  Administered 2023-08-02: 1000 mg via ORAL
  Filled 2023-08-02: qty 2

## 2023-08-02 MED ORDER — SODIUM CHLORIDE 0.9 % IV SOLN: 2.0000 g | INTRAVENOUS | Status: DC

## 2023-08-02 MED ORDER — KETOROLAC TROMETHAMINE 30 MG/ML IJ SOLN
30.0000 mg | Freq: Four times a day (QID) | INTRAMUSCULAR | Status: DC
  Administered 2023-08-02: 30 mg via INTRAVENOUS
  Filled 2023-08-02: qty 1

## 2023-08-02 MED ORDER — IBUPROFEN 200 MG PO TABS: 600.0000 mg | ORAL_TABLET | Freq: Three times a day (TID) | ORAL | Status: AC | PRN

## 2023-08-02 MED ORDER — ONDANSETRON HCL 4 MG/2ML IJ SOLN
INTRAMUSCULAR | Status: AC
  Filled 2023-08-02: qty 2

## 2023-08-02 MED ORDER — MIDAZOLAM HCL 5 MG/5ML IJ SOLN
INTRAMUSCULAR | Status: DC | PRN
  Administered 2023-08-02: 2 mg via INTRAVENOUS

## 2023-08-02 MED ORDER — PROPOFOL 10 MG/ML IV BOLUS
INTRAVENOUS | Status: AC
  Filled 2023-08-02: qty 20

## 2023-08-02 MED ORDER — DROPERIDOL 2.5 MG/ML IJ SOLN: 0.6250 mg | Freq: Once | INTRAMUSCULAR | Status: DC | PRN

## 2023-08-02 MED ORDER — ACETAMINOPHEN 500 MG PO TABS: 1000.0000 mg | ORAL_TABLET | Freq: Four times a day (QID) | ORAL | Status: AC | PRN

## 2023-08-02 MED ORDER — CHLORHEXIDINE GLUCONATE 0.12 % MT SOLN
15.0000 mL | Freq: Once | OROMUCOSAL | Status: AC
  Administered 2023-08-02: 15 mL via OROMUCOSAL

## 2023-08-02 MED ORDER — SUCCINYLCHOLINE CHLORIDE 200 MG/10ML IV SOSY
PREFILLED_SYRINGE | INTRAVENOUS | Status: DC | PRN
Start: 2023-08-02 — End: 2023-08-02
  Administered 2023-08-02: 120 mg via INTRAVENOUS

## 2023-08-02 MED ORDER — OXYCODONE HCL 5 MG PO TABS: 5.0000 mg | ORAL_TABLET | Freq: Four times a day (QID) | ORAL | 0 refills | Status: AC | PRN

## 2023-08-02 MED ORDER — ONDANSETRON HCL 4 MG/2ML IJ SOLN
4.0000 mg | Freq: Four times a day (QID) | INTRAMUSCULAR | Status: DC | PRN
  Administered 2023-08-02: 4 mg via INTRAVENOUS

## 2023-08-02 MED ORDER — MIDAZOLAM HCL 2 MG/2ML IJ SOLN
INTRAMUSCULAR | Status: AC
  Filled 2023-08-02: qty 2

## 2023-08-02 MED ORDER — LACTATED RINGERS IV SOLN: INTRAVENOUS | Status: DC

## 2023-08-02 MED ORDER — SIMETHICONE 80 MG PO CHEW: 40.0000 mg | CHEWABLE_TABLET | Freq: Four times a day (QID) | ORAL | Status: DC | PRN

## 2023-08-02 MED ORDER — BUPIVACAINE HCL (PF) 0.5 % IJ SOLN
INTRAMUSCULAR | Status: DC | PRN
  Administered 2023-08-02: 20 mL

## 2023-08-02 MED ORDER — OXYCODONE HCL 5 MG PO TABS: 5.0000 mg | ORAL_TABLET | ORAL | Status: DC | PRN

## 2023-08-02 MED ORDER — DOCUSATE SODIUM 100 MG PO CAPS: 100.0000 mg | ORAL_CAPSULE | Freq: Two times a day (BID) | ORAL | Status: DC

## 2023-08-02 MED ORDER — LACTATED RINGERS IV SOLN: INTRAVENOUS | Status: DC | PRN

## 2023-08-02 MED ORDER — ROCURONIUM BROMIDE 100 MG/10ML IV SOLN
INTRAVENOUS | Status: DC | PRN
  Administered 2023-08-02: 20 mg via INTRAVENOUS

## 2023-08-02 MED ORDER — ACETAMINOPHEN 500 MG PO TABS
1000.0000 mg | ORAL_TABLET | Freq: Four times a day (QID) | ORAL | Status: DC
  Administered 2023-08-02: 1000 mg via ORAL
  Filled 2023-08-02: qty 2

## 2023-08-02 MED ORDER — DIPHENHYDRAMINE HCL 25 MG PO CAPS: 25.0000 mg | ORAL_CAPSULE | Freq: Four times a day (QID) | ORAL | Status: DC | PRN

## 2023-08-02 MED ORDER — OXYCODONE HCL 5 MG PO TABS: 5.0000 mg | ORAL_TABLET | Freq: Once | ORAL | Status: AC | PRN

## 2023-08-02 MED ORDER — DEXAMETHASONE SODIUM PHOSPHATE 10 MG/ML IJ SOLN
INTRAMUSCULAR | Status: AC
  Filled 2023-08-02: qty 1

## 2023-08-02 MED ORDER — OXYCODONE HCL 5 MG/5ML PO SOLN: 5.0000 mg | Freq: Once | ORAL | Status: AC | PRN

## 2023-08-02 MED ORDER — FENTANYL CITRATE PF 50 MCG/ML IJ SOSY
25.0000 ug | PREFILLED_SYRINGE | INTRAMUSCULAR | Status: DC | PRN
  Administered 2023-08-02: 50 ug via INTRAVENOUS

## 2023-08-02 MED ORDER — METHOCARBAMOL 500 MG PO TABS: 500.0000 mg | ORAL_TABLET | Freq: Three times a day (TID) | ORAL | Status: DC | PRN

## 2023-08-02 MED ORDER — SUCCINYLCHOLINE CHLORIDE 200 MG/10ML IV SOSY
PREFILLED_SYRINGE | INTRAVENOUS | Status: AC
  Filled 2023-08-02: qty 10

## 2023-08-02 MED ORDER — ONDANSETRON HCL 4 MG/2ML IJ SOLN: 4.0000 mg | Freq: Once | INTRAMUSCULAR | Status: DC | PRN

## 2023-08-02 MED ORDER — FENTANYL CITRATE (PF) 100 MCG/2ML IJ SOLN
INTRAMUSCULAR | Status: AC
  Filled 2023-08-02: qty 2

## 2023-08-02 MED ORDER — FENTANYL CITRATE PF 50 MCG/ML IJ SOSY
PREFILLED_SYRINGE | INTRAMUSCULAR | Status: AC
  Administered 2023-08-02: 50 ug via INTRAVENOUS
  Filled 2023-08-02: qty 2

## 2023-08-02 MED ORDER — ENOXAPARIN SODIUM 40 MG/0.4ML IJ SOSY: 40.0000 mg | PREFILLED_SYRINGE | INTRAMUSCULAR | Status: DC

## 2023-08-02 MED ORDER — LIDOCAINE HCL (PF) 2 % IJ SOLN
INTRAMUSCULAR | Status: AC
  Filled 2023-08-02: qty 5

## 2023-08-02 MED ORDER — DIPHENHYDRAMINE HCL 50 MG/ML IJ SOLN: 25.0000 mg | Freq: Four times a day (QID) | INTRAMUSCULAR | Status: DC | PRN

## 2023-08-02 MED ORDER — OXYCODONE HCL 5 MG PO TABS
ORAL_TABLET | ORAL | Status: AC
  Administered 2023-08-02: 5 mg via ORAL
  Filled 2023-08-02: qty 1

## 2023-08-02 MED ORDER — PHENYLEPHRINE 80 MCG/ML (10ML) SYRINGE FOR IV PUSH (FOR BLOOD PRESSURE SUPPORT)
PREFILLED_SYRINGE | INTRAVENOUS | Status: DC | PRN
  Administered 2023-08-02: 160 ug via INTRAVENOUS

## 2023-08-02 MED ORDER — METRONIDAZOLE 500 MG/100ML IV SOLN: 500.0000 mg | Freq: Two times a day (BID) | INTRAVENOUS | Status: DC

## 2023-08-02 MED ORDER — ONDANSETRON 4 MG PO TBDP: 4.0000 mg | ORAL_TABLET | Freq: Four times a day (QID) | ORAL | Status: DC | PRN

## 2023-08-02 MED ORDER — DEXAMETHASONE SODIUM PHOSPHATE 10 MG/ML IJ SOLN
INTRAMUSCULAR | Status: DC | PRN
  Administered 2023-08-02: 8 mg via INTRAVENOUS

## 2023-08-02 MED ORDER — SUGAMMADEX SODIUM 200 MG/2ML IV SOLN
INTRAVENOUS | Status: DC | PRN
  Administered 2023-08-02: 200 mg via INTRAVENOUS

## 2023-08-02 MED ORDER — SODIUM CHLORIDE 0.9 % IV SOLN
2.0000 g | Freq: Once | INTRAVENOUS | Status: AC
  Administered 2023-08-02: 2 g via INTRAVENOUS
  Filled 2023-08-02: qty 20

## 2023-08-02 MED ORDER — METRONIDAZOLE 500 MG/100ML IV SOLN
500.0000 mg | Freq: Once | INTRAVENOUS | Status: AC
  Administered 2023-08-02: 500 mg via INTRAVENOUS
  Filled 2023-08-02: qty 100

## 2023-08-02 MED ORDER — EPHEDRINE SULFATE-NACL 50-0.9 MG/10ML-% IV SOSY
PREFILLED_SYRINGE | INTRAVENOUS | Status: DC | PRN
  Administered 2023-08-02: 10 mg via INTRAVENOUS

## 2023-08-02 MED ORDER — LIDOCAINE HCL (CARDIAC) PF 100 MG/5ML IV SOSY
PREFILLED_SYRINGE | INTRAVENOUS | Status: DC | PRN
  Administered 2023-08-02: 100 mg via INTRAVENOUS

## 2023-08-02 MED ORDER — BUPIVACAINE HCL (PF) 0.5 % IJ SOLN
INTRAMUSCULAR | Status: AC
  Filled 2023-08-02: qty 30

## 2023-08-02 MED ORDER — FENTANYL CITRATE (PF) 100 MCG/2ML IJ SOLN
INTRAMUSCULAR | Status: DC | PRN
  Administered 2023-08-02: 100 ug via INTRAVENOUS

## 2023-08-02 MED ORDER — PROPOFOL 10 MG/ML IV BOLUS
INTRAVENOUS | Status: DC | PRN
  Administered 2023-08-02: 150 mg via INTRAVENOUS

## 2023-08-02 SURGICAL SUPPLY — 30 items
BAG COUNTER SPONGE SURGICOUNT (BAG) IMPLANT
CABLE HIGH FREQUENCY MONO STRZ (ELECTRODE) IMPLANT
CHLORAPREP W/TINT 26 (MISCELLANEOUS) ×2 IMPLANT
CLIP APPLIE 5 13 M/L LIGAMAX5 (MISCELLANEOUS) IMPLANT
COVER SURGICAL LIGHT HANDLE (MISCELLANEOUS) ×2 IMPLANT
CUTTER FLEX LINEAR 45M (STAPLE) IMPLANT
DERMABOND ADVANCED .7 DNX12 (GAUZE/BANDAGES/DRESSINGS) ×2 IMPLANT
ELECT REM PT RETURN 15FT ADLT (MISCELLANEOUS) ×2 IMPLANT
GLOVE BIO SURGEON STRL SZ7.5 (GLOVE) ×2 IMPLANT
GOWN STRL REUS W/ TWL XL LVL3 (GOWN DISPOSABLE) ×2 IMPLANT
IRRIGATION SUCT STRKRFLW 2 WTP (MISCELLANEOUS) ×2 IMPLANT
KIT BASIN OR (CUSTOM PROCEDURE TRAY) ×2 IMPLANT
KIT TURNOVER KIT A (KITS) IMPLANT
PENCIL SMOKE EVACUATOR (MISCELLANEOUS) IMPLANT
RELOAD 45 VASCULAR/THIN (ENDOMECHANICALS) IMPLANT
RELOAD STAPLE 45 2.5 WHT GRN (ENDOMECHANICALS) IMPLANT
RELOAD STAPLE 45 3.5 BLU ETS (ENDOMECHANICALS) IMPLANT
RELOAD STAPLE TA45 3.5 REG BLU (ENDOMECHANICALS) ×1 IMPLANT
SET TUBE SMOKE EVAC HIGH FLOW (TUBING) ×2 IMPLANT
SHEARS HARMONIC 36 ACE (MISCELLANEOUS) ×2 IMPLANT
SLEEVE Z-THREAD 5X100MM (TROCAR) ×2 IMPLANT
SPIKE FLUID TRANSFER (MISCELLANEOUS) ×2 IMPLANT
SUT MNCRL AB 4-0 PS2 18 (SUTURE) ×2 IMPLANT
SUT VICRYL 0 UR6 27IN ABS (SUTURE) IMPLANT
SYSTEM BAG RETRIEVAL 10MM (BASKET) ×2 IMPLANT
TOWEL OR 17X26 10 PK STRL BLUE (TOWEL DISPOSABLE) ×2 IMPLANT
TRAY FOLEY MTR SLVR 16FR STAT (SET/KITS/TRAYS/PACK) IMPLANT
TRAY LAPAROSCOPIC (CUSTOM PROCEDURE TRAY) ×2 IMPLANT
TROCAR BALLN 12MMX100 BLUNT (TROCAR) ×2 IMPLANT
TROCAR Z-THREAD OPTICAL 5X100M (TROCAR) ×2 IMPLANT

## 2023-08-02 NOTE — Progress Notes (Signed)
 Spoke to patient via Christophe Cram, the interpreter, to let her know we will be transferring her to [facility] for her surgery today as they  have more OR availability.  Patient is agreeable with no issues.  She had no questions.  Husband at the bedside agreeable as well.  [facility] has been called and they will transport the patient to [facility] pre-op.  Leone Ralphs 8:16 AM 08/02/2023

## 2023-08-02 NOTE — Progress Notes (Signed)
 Patient ID: Danielle Campbell, female   DOB: 04-28-1975, 48 y.o.   MRN: [id number]   Pre Procedure note for inpatients:   Danielle Rinne Jesus Terrones Renteria has been scheduled for Procedure(s): APPENDECTOMY, LAPAROSCOPIC (N/A) today. The various methods of treatment have been discussed with the patient. After consideration of the risks, benefits and treatment options the patient has consented to the planned procedure.   The patient has been seen and labs reviewed. There are no changes in the patient's condition to prevent proceeding with the planned procedure today.  Recent labs:  Lab Results  Component Value Date   WBC 10.6 (H) 08/02/2023   HGB 10.9 (L) 08/02/2023   HCT 35.1 (L) 08/02/2023   PLT 344 08/02/2023   GLUCOSE 96 08/02/2023   ALT 26 08/01/2023   AST 34 08/01/2023   NA 136 08/02/2023   K 3.9 08/02/2023   CL 105 08/02/2023   CREATININE 0.70 08/02/2023   BUN 8 08/02/2023   CO2 19 (L) 08/02/2023    Oza Blumenthal, MD 08/02/2023 10:14 AM

## 2023-08-02 NOTE — ED Notes (Signed)
 UA preg sent to lab

## 2023-08-02 NOTE — H&P (Signed)
 Reason for Consult/Chief Complaint: appendicitis Consultant: Otilio Block, [place]  Danielle Campbell is an 47 y.o. female.   HPI: 38F p/w acute onset of n/v, abdominal pain, and chills that began after breakfast 4/29. Denies fever. Last BM 4/29 that she reports as exceptionally malodorous and loose. No prior colonoscopy.   Past Medical History:  Diagnosis Date   Dysmenorrhea 2018   GERD (gastroesophageal reflux disease)    Positive TB test    normal chest x-ray on 06/19/2020, as of 10/07/21, pt states she has never been sick with TB or had any symptoms   Uterus prolapse    Wears glasses     Past Surgical History:  Procedure Laterality Date   BLADDER SUSPENSION N/A 10/20/2021   Procedure: TRANSVAGINAL TAPE (TVT) PROCEDURE;  Surgeon: Arma Lamp, MD;  Location: [facility];  Service: Gynecology;  Laterality: N/A;   CHOLECYSTECTOMY N/A 10/21/2013   Procedure: LAPAROSCOPIC CHOLECYSTECTOMY WITH INTRAOPERATIVE CHOLANGIOGRAM;  Surgeon: Shela Derby, MD;  Location: [facility] OR;  Service: General;  Laterality: N/A;   CYSTOSCOPY N/A 10/20/2021   Procedure: CYSTOSCOPY;  Surgeon: Arma Lamp, MD;  Location: [facility];  Service: Gynecology;  Laterality: N/A;   XI ROBOTIC ASSISTED TOTAL HYSTERECTOMY WITH SACROCOLPOPEXY N/A 10/20/2021   Procedure: XI ROBOTIC ASSISTED TOTAL HYSTERECTOMY WITH RIGHT SALPINGOOPHORECTOMY, LEFT SALPINGECTOMY AND SACROCOLPOPEXY;  Surgeon: Arma Lamp, MD;  Location: [facility];  Service: Gynecology;  Laterality: N/A;  total time requested is 3.5 hours    Family History  Problem Relation Age of Onset   Hypercholesterolemia Mother    Breast cancer Neg Hx     Social History:  reports that she has never smoked. She has never used smokeless tobacco. She reports that she does not drink alcohol and does not use drugs.  Allergies:  Allergies  Allergen Reactions   Hydrocodone  Anaphylaxis    Morphine      Abdominal pain    Medications: I have reviewed the patient's current medications.  Results for orders placed or performed during the hospital encounter of 08/01/23 (from the past 48 hours)  Lipase, blood     Status: None   Collection Time: 08/01/23  5:29 PM  Result Value Ref Range   Lipase 29 11 - 51 U/L    Comment: Performed at [facility], [address] [place], [place] [address]  Comprehensive metabolic panel     Status: Abnormal   Collection Time: 08/01/23  5:29 PM  Result Value Ref Range   Sodium 133 (L) 135 - 145 mmol/L   Potassium 3.8 3.5 - 5.1 mmol/L   Chloride 101 98 - 111 mmol/L   CO2 21 (L) 22 - 32 mmol/L   Glucose, Bld 170 (H) 70 - 99 mg/dL    Comment: Glucose reference range applies only to samples taken after fasting for at least 8 hours.   BUN 11 6 - 20 mg/dL   Creatinine, Ser 6.04 0.44 - 1.00 mg/dL   Calcium 9.1 8.9 - 54.0 mg/dL   Total Protein 8.1 6.5 - 8.1 g/dL   Albumin 3.8 3.5 - 5.0 g/dL   AST 34 15 - 41 U/L   ALT 26 0 - 44 U/L   Alkaline Phosphatase 94 38 - 126 U/L   Total Bilirubin 0.7 0.0 - 1.2 mg/dL   GFR, Estimated >98 >11 mL/min    Comment: (NOTE) Calculated using the CKD-EPI Creatinine Equation (2021)    Anion gap 11 5 -  15    Comment: Performed at [facility], [address], [place], [place] [address]  CBC     Status: Abnormal   Collection Time: 08/01/23  5:29 PM  Result Value Ref Range   WBC 14.7 (H) 4.0 - 10.5 K/uL   RBC 4.22 3.87 - 5.11 MIL/uL   Hemoglobin 10.8 (L) 12.0 - 15.0 g/dL   HCT 60.4 (L) 54.0 - 98.1 %   MCV 81.8 80.0 - 100.0 fL   MCH 25.6 (L) 26.0 - 34.0 pg   MCHC 31.3 30.0 - 36.0 g/dL   RDW 19.1 47.8 - 29.5 %   Platelets 378 150 - 400 K/uL   nRBC 0.0 0.0 - 0.2 %    Comment: Performed at [facility], [address] [place], [place] [address]  Urinalysis, Routine w reflex microscopic -Urine, Clean Catch     Status: Abnormal   Collection Time: 08/01/23  6:23 PM  Result Value Ref Range    Color, Urine YELLOW YELLOW   APPearance CLEAR CLEAR   Specific Gravity, Urine 1.004 (L) 1.005 - 1.030   pH 7.0 5.0 - 8.0   Glucose, UA NEGATIVE NEGATIVE mg/dL   Hgb urine dipstick NEGATIVE NEGATIVE   Bilirubin Urine NEGATIVE NEGATIVE   Ketones, ur NEGATIVE NEGATIVE mg/dL   Protein, ur NEGATIVE NEGATIVE mg/dL   Nitrite NEGATIVE NEGATIVE   Leukocytes,Ua NEGATIVE NEGATIVE    Comment: Performed at [facility], [address], [place], [place] [address]    CT ABDOMEN PELVIS W CONTRAST Result Date: 08/01/2023 CLINICAL DATA:  RLQ abdominal pain ANDepigastric pain. Pt complains of chills, abd pain, vomiting and diarrhea this morning. EXAM: CT ABDOMEN AND PELVIS WITH CONTRAST TECHNIQUE: Multidetector CT imaging of the abdomen and pelvis was performed using the standard protocol following bolus administration of intravenous contrast. RADIATION DOSE REDUCTION: This exam was performed according to the departmental dose-optimization program which includes automated exposure control, adjustment of the mA and/or kV according to patient size and/or use of iterative reconstruction technique. CONTRAST:  75mL OMNIPAQUE  IOHEXOL  350 MG/ML SOLN COMPARISON:  CT abdomen pelvis 10/06/2022 FINDINGS: Lower chest: No acute abnormality. Hepatobiliary: No focal liver abnormality. Status post cholecystectomy. No biliary dilatation. Pancreas: No focal lesion. Normal pancreatic contour. No surrounding inflammatory changes. No main pancreatic ductal dilatation. Spleen: Normal in size without focal abnormality. Adrenals/Urinary Tract: No adrenal nodule bilaterally. Bilateral kidneys enhance symmetrically. No hydronephrosis. No hydroureter. The urinary bladder is unremarkable. Stomach/Bowel: Stomach is within normal limits. No evidence of bowel wall thickening or dilatation. Colonic diverticulosis. The distal appendiceal lumen is dilated measuring up to 1.3 cm with associated appendiceal wall thickening and periappendiceal fat  stranding. No appendicolith. Vascular/Lymphatic: No abdominal aorta or iliac aneurysm. Trace noncalcified atherosclerotic plaque. No abdominal, pelvic, or inguinal lymphadenopathy. Reproductive: Status post hysterectomy. No adnexal masses. Other: No intraperitoneal free fluid. No intraperitoneal free gas. No organized fluid collection. Musculoskeletal: Small fat containing umbilical hernia. No suspicious lytic or blastic osseous lesions. No acute displaced fracture. IMPRESSION: 1. Non-perforated acute tip appendicitis.  No appendicolith. 2. Colonic diverticulosis with no acute diverticulitis. 3.  Aortic Atherosclerosis (ICD10-I70.0). Electronically Signed   By: Morgane  Naveau M.D.   On: 08/01/2023 23:50    ROS 10 point review of systems is negative except as listed above in HPI.   Physical Exam Blood pressure 121/68, pulse 89, temperature 98.4 F (36.9 C), resp. rate 17, weight 85.3 kg, last menstrual period 09/12/2021, SpO2 99%. Constitutional: well-developed, well-nourished HEENT: pupils equal,  round, reactive to light, 2mm b/l, moist conjunctiva, external inspection of ears and nose normal, hearing intact Oropharynx: normal oropharyngeal mucosa, normal dentition Neck: no thyromegaly, trachea midline, no midline cervical tenderness to palpation Chest: breath sounds equal bilaterally, normal respiratory effort, no midline or lateral chest wall tenderness to palpation/deformity Abdomen: soft, RLQ TTP, no bruising, no hepatosplenomegaly GU: normal female genitalia  Skin: warm, dry, no rashes Psych: normal memory, normal mood/affect     Assessment/Plan: Acute appendicitis - discussed surgical and non-surgical treatment options. Patient elects for lap appy. Timing/location to be determined by day shift team. Informed consent was obtained after detailed explanation of risks, including bleeding, infection, abscess, staple line leak, stump appendicitis, injury to surrounding structures, and need for  conversion to open procedure. All questions answered to the patient's satisfaction. History, physical, and informed consent obtained/performed in Spanish using interpreter services, Waunita Haff [phone].  Colon cancer screening - recommend colonoscopy after resolution of appendicitis FEN - strict NPO DVT - SCDs, LMWH Dispo - med-surg    Anda Bamberg, MD General and Trauma Surgery [facility] Surgery

## 2023-08-02 NOTE — Anesthesia Preprocedure Evaluation (Addendum)
 Anesthesia Evaluation  Patient identified by MRN, date of birth, ID band Patient awake    Reviewed: Allergy & Precautions, NPO status , Patient's Chart, lab work & pertinent test results  Airway Mallampati: II  TM Distance: >3 FB     Dental no notable dental hx. (+) Teeth Intact, Dental Advisory Given   Pulmonary neg pulmonary ROS   Pulmonary exam normal breath sounds clear to auscultation       Cardiovascular negative cardio ROS Normal cardiovascular exam Rhythm:Regular Rate:Normal     Neuro/Psych negative neurological ROS  negative psych ROS   GI/Hepatic Neg liver ROS,GERD  Medicated,,Acute Appendicitis   Endo/Other  Obesity  Renal/GU negative Renal ROS  negative genitourinary   Musculoskeletal negative musculoskeletal ROS (+)    Abdominal  (+) + obese Abdomen: tender.   Peds  Hematology  (+) Blood dyscrasia, anemia   Anesthesia Other Findings   Reproductive/Obstetrics negative OB ROS                             Anesthesia Physical Anesthesia Plan  ASA: 2  Anesthesia Plan: General   Post-op Pain Management: Minimal or no pain anticipated and Dilaudid  IV   Induction: Intravenous and Cricoid pressure planned  PONV Risk Score and Plan: 4 or greater and Scopolamine patch - Pre-op, Midazolam , Treatment may vary due to age or medical condition, Ondansetron  and Dexamethasone   Airway Management Planned: Oral ETT  Additional Equipment: None  Intra-op Plan:   Post-operative Plan: Extubation in OR  Informed Consent: I have reviewed the patients History and Physical, chart, labs and discussed the procedure including the risks, benefits and alternatives for the proposed anesthesia with the patient or authorized representative who has indicated his/her understanding and acceptance.     Dental advisory given  Plan Discussed with: CRNA and Anesthesiologist  Anesthesia Plan  Comments:         Anesthesia Quick Evaluation

## 2023-08-02 NOTE — Transfer of Care (Signed)
 Immediate Anesthesia Transfer of Care Note  Patient: Danielle Campbell  Procedure(s) Performed: APPENDECTOMY, LAPAROSCOPIC  Patient Location: PACU  Anesthesia Type:General  Level of Consciousness: awake, alert , and oriented  Airway & Oxygen Therapy: Patient Spontanous Breathing and Patient connected to face mask oxygen  Post-op Assessment: Report given to RN  Post vital signs: Reviewed and stable  Last Vitals:  Vitals Value Taken Time  BP    Temp    Pulse    Resp    SpO2      Last Pain:  Vitals:   08/02/23 0900  TempSrc: Oral  PainSc:          Complications: No notable events documented.

## 2023-08-02 NOTE — Op Note (Signed)
 Appendectomy, Lap, Procedure Note  Indications: The patient presented with a history of right-sided abdominal pain. A CT revealed findings consistent with acute appendicitis.  Pre-operative Diagnosis: acute appendicitis  Post-operative Diagnosis: Same  Surgeon: Oza Blumenthal   Assistants: none  Anesthesia: General endotracheal anesthesia  ASA Class: 2  Procedure Details  The patient was seen again in the Holding Room. The risks, benefits, complications, treatment options, and expected outcomes were discussed with the patient and/or family. The possibilities of reaction to medication, perforation of viscus, bleeding, recurrent infection, finding a normal appendix, the need for additional procedures, failure to diagnose a condition, and creating a complication requiring transfusion or operation were discussed. There was concurrence with the proposed plan and informed consent was obtained. The site of surgery was properly noted. The patient was taken to Operating Room, identified as Danielle Campbell and the procedure verified as Appendectomy. A Time Out was held and the above information confirmed.  The patient was placed in the supine position and general anesthesia was induced, along with placement of orogastric tube, Venodyne boots, and a Foley catheter. The abdomen was prepped and draped in a sterile fashion. A one centimeter infraumbilical incision was made.  The midline fascia was incised with a #15 blade.  A Kelly clamp was used to confirm entrance into the peritoneal cavity.  A pursestring suture was passed around the incision with a 0 Vicryl.  The Hasson was introduced into the abdomen and the tails of the suture were used to hold the Hasson in place.   The pneumoperitoneum was then established to steady pressure of 15 mmHg.  Additional 5 mm cannulas then placed in the left lower quadrant of the abdomen and the right upper quadrant under direct visualization. A careful  evaluation of the entire abdomen was carried out. The patient was placed in Trendelenburg and left lateral decubitus position. The small intestines were retracted in the cephalad and left lateral direction away from the pelvis and right lower quadrant. The patient was found to have an enlarged and inflamed appendix that was extending into the pelvis. There was no evidence of perforation.  The appendix was carefully dissected. The appendix was was skeletonized with the harmonic scalpel.   The appendix was divided at its base using an endo-GIA stapler. Minimal appendiceal stump was left in place. There was no evidence of bleeding, leakage, or complication after division of the appendix. Irrigation was also performed and irrigate suctioned from the abdomen as well.  The umbilical port site was closed with the purse string suture. There was no residual palpable fascial defect.  The trocar site skin wounds were closed with 4-0 Monocryl.  Instrument, sponge, and needle counts were correct at the conclusion of the case.   Findings: The appendix was found to be inflamed. There were not signs of necrosis.  There was not perforation. There was not abscess formation.  Estimated Blood Loss:  Minimal         Drains:none         Complications:  None; patient tolerated the procedure well.         Disposition: PACU - hemodynamically stable.         Condition: stable

## 2023-08-02 NOTE — Progress Notes (Addendum)
 [facility] [employer] called to request interpreter for patient, left message  Currently using [employer] for interpreting  9:42 AM [employer]pre[employer] services returned call, waiting for return call back from vendor.

## 2023-08-02 NOTE — ED Notes (Addendum)
 PA called this nurse, stating that PT would be tx. When this nurse asked if PT had been told, PA stated no. PA then stated that she would not be telling them as "I do not have the time". Provider will need to come explain the need to transfer.

## 2023-08-02 NOTE — Anesthesia Postprocedure Evaluation (Signed)
 Anesthesia Post Note  Patient: Danielle Campbell  Procedure(s) Performed: APPENDECTOMY, LAPAROSCOPIC     Patient location during evaluation: PACU Anesthesia Type: General Level of consciousness: awake and alert and oriented Pain management: pain level controlled Vital Signs Assessment: post-procedure vital signs reviewed and stable Respiratory status: spontaneous breathing, nonlabored ventilation and respiratory function stable Cardiovascular status: blood pressure returned to baseline and stable Postop Assessment: no apparent nausea or vomiting Anesthetic complications: no   No notable events documented.  Last Vitals:  Vitals:   08/02/23 1200 08/02/23 1230  BP: 113/71 (!) 112/56  Pulse: 72 74  Resp: 15 15  Temp:    SpO2: 100% 92%    Last Pain:  Vitals:   08/02/23 1230  TempSrc:   PainSc: 5                  [name] A.

## 2023-08-03 ENCOUNTER — Encounter (HOSPITAL_COMMUNITY): Payer: Self-pay | Admitting: Surgery

## 2023-08-03 LAB — SURGICAL PATHOLOGY

## 2023-08-04 NOTE — Discharge Summary (Signed)
 [facility] Discharge Summary   Patient ID: Danielle Campbell MRN: [id number] DOB/AGE: 10-14-1975 48 y.o.  Admit date: 08/01/2023 Discharge date: 08/02/2023  Admitting Diagnosis: Appendicitis    Discharge Diagnosis Patient Active Problem List   Diagnosis Date Noted   Acute appendicitis 08/02/2023   Uterovaginal prolapse, incomplete 10/20/2021   Cholelithiasis and acute cholecystitis without obstruction 10/21/2013    Consultants None   Imaging: No results found.  Procedures  Dr. Lucienne Ryder (08/02/23) - Laparoscopic Appendectomy  Hospital Course:  48 y/o F who presented to the ED with abdominal pain, nausea, vomiting.  Workup showed appendicitis.  Patient was admitted and underwent procedure listed above.  Tolerated procedure well.  Diet was advanced as tolerated.  On POD#0, the patient was voiding well, tolerating diet, ambulating well, pain well controlled, vital signs stable, incisions c/d/i and felt stable for discharge home.  Patient will follow up in our office as below and knows to call with questions or concerns.     I have personally reviewed the patients medication history on the [place] controlled substance database.   Physical Exam: General:  Alert, NAD, pleasant, comfortable Abd:  Soft, ND, mild tenderness, incisions C/D/  Allergies as of 08/02/2023       Reactions   Hydrocodone  Anaphylaxis   Morphine     Abdominal pain        Medication List     TAKE these medications    acetaminophen  500 MG tablet Commonly known as: TYLENOL  Take 2 tablets (1,000 mg total) by mouth every 6 (six) hours as needed.   famotidine  20 MG tablet Commonly known as: PEPCID  Take 1 tablet (20 mg total) by mouth 2 (two) times daily. What changed: when to take this   ibuprofen  200 MG tablet Commonly known as: ADVIL  Take 3 tablets (600 mg total) by mouth every 8 (eight) hours as needed. What changed:  medication strength when to take this   MACA  ROOT PO Take 1 capsule by mouth daily.   Magnesium Citrate 100 MG Tabs Take by mouth every evening.   oxyCODONE  5 MG immediate release tablet Commonly known as: Oxy IR/ROXICODONE  Take 1 tablet (5 mg total) by mouth every 6 (six) hours as needed for severe pain (pain score 7-10) (not releived by tylenol  and advil ).   Potassium 95 MG Tabs Take 95 mg by mouth daily.   VALERIAN ROOT PO Take by mouth every evening.   WOMENS MULTIVITAMIN PO Take by mouth.          Follow-up Information     Maczis, Puja Gosai, PA-C Follow up on 08/29/2023.   Specialty: General Surgery Why: 9:15am, Arrive 30 minutes prior to your appointment time, Please bring your insurance card and photo ID Contact information: [address] [facility] [place] [address] [phone]                 Signed: Michial Akin, [facility] 08/04/2023, 1:57 PM

## 2023-08-29 ENCOUNTER — Emergency Department (HOSPITAL_COMMUNITY)
Admission: EM | Admit: 2023-08-29 | Discharge: 2023-08-30 | Disposition: A | Discharge status: Home / Self Care | Payer: Self-pay | Attending: Student | Admitting: Student

## 2023-08-29 ENCOUNTER — Encounter (HOSPITAL_COMMUNITY): Payer: Self-pay

## 2023-08-29 ENCOUNTER — Other Ambulatory Visit: Payer: Self-pay

## 2023-08-29 DIAGNOSIS — H1033 Unspecified acute conjunctivitis, bilateral: Secondary | ICD-10-CM | POA: Insufficient documentation

## 2023-08-29 DIAGNOSIS — R519 Headache, unspecified: Secondary | ICD-10-CM | POA: Insufficient documentation

## 2023-08-29 MED ORDER — PROCHLORPERAZINE EDISYLATE 10 MG/2ML IJ SOLN
10.0000 mg | Freq: Once | INTRAMUSCULAR | Status: AC
  Administered 2023-08-29: 10 mg via INTRAVENOUS
  Filled 2023-08-29: qty 2

## 2023-08-29 MED ORDER — DIPHENHYDRAMINE HCL 50 MG/ML IJ SOLN
50.0000 mg | Freq: Once | INTRAMUSCULAR | Status: AC
  Administered 2023-08-29: 50 mg via INTRAVENOUS
  Filled 2023-08-29: qty 1

## 2023-08-29 NOTE — ED Triage Notes (Signed)
 Patient reports headache with left eye burning and difficult to open, lip swelling, left ear is flushed, right side decreased sensation to face and arm, right arm is cooler than left side, right side back of her head is numb. Patient reports no tongue swelling or itching. No difficulty breathing.

## 2023-08-29 NOTE — ED Provider Triage Note (Signed)
 Emergency Medicine Provider Triage Evaluation Note  Danielle Campbell , a 48 y.o. female  was evaluated in triage.  Pt complains of sudden onset pain in head and neck with associated numbness that has waxed and waned.  Review of Systems  Positive: Neck pain, headache starting at 9 PM.  Tingling and numbness feeling to face and right arm.  Numbness symptoms resolved.  Possible right facial rash. Negative: Fevers, chills, chest pain, shortness of breath, nausea, vomiting, constipation, diarrhea, urinary changes.  Physical Exam  BP (!) 159/90 (BP Location: Right Arm)   Pulse 72   Temp 98.1 F (36.7 C)   Resp 20   Ht 5\' 3"  (1.6 m)   Wt 87.5 kg   LMP 09/12/2021 (Approximate)   SpO2 100%   BMI 34.19 kg/m  Gen:   Awake, no distress   Resp:  Normal effort  MSK:   Moves extremities without difficulty, no focal numbness or weakness on neuroexam.  Normal finger-nose-finger testing.  Symmetric smile.  Clear speech.  Pupil symmetric and reactive with normal extraocular movements.  Faint rash on right forehead and right temporal area and right cheek. Other:  No focal tenderness on my exam initially.  Medical Decision Making  Medically screening exam initiated at 11:12 PM.  Appropriate orders placed.  Danielle Campbell was informed that the remainder of the evaluation will be completed by another provider, this initial triage assessment does not replace that evaluation, and the importance of remaining in the ED until their evaluation is complete.   Danielle Campbell is a 48 y.o. female with a past medical history significant for GERD, previous cholecystectomy, and recent appendicitis with appendectomy who presents with 2 hours of headache/neck pain and neurologic complaint.  According to patient, at 9 PM she was watching a TV show when she had onset of pain in her neck and head.  She reports it was probably on the right side of her neck and head and on the  right side of her face.  She reports she thought her left eye was swollen shut but is now back to normal.  She noticed some tingling in her face and to her right arm that has also resolved.  She currently reports the headache but denies any focal neurologic complaint persistence.  She denies any history of this.  Denies any history of rashes.  She denies any history of shingles.  She denies trauma.  Denies history of other neurologic problems or stroke.  She initially told triage that she felt her lips were swollen but now just says her mouth feels dry.  Denies any other injuries.  On exam, lungs clear.  Chest nontender.  Abdomen nontender.  For me with neuroexam she had intact sensation and strength in all extremities.  Symmetric smile.  Clear speech.  Pupils are symmetric and reactive with normal extract movements.  Eyebrows and forehead with symmetric and she did not have any numbness on my exam.  She did not have a carotid bruit.  She did not have focal neck tenderness or temporal tenderness.  There was a very faint rash on her right temporal area, right forehead, and right cheek that they are unsure if it is new or not.  Family thinks it might be new.  She denies any numbness or weakness in her legs and now has no numbness or weakness in her arms.  Exam otherwise unremarkable.  Given her lack of any focal neurologic deficits,  have low suspicion for acute stroke.  Will get CTA of the head and neck given the pain in her neck and head that started suddenly and these possible transient neurologic symptoms.  With the new rash, shingles is a consideration so when she gets seen in an exam room she may need further eye examination.  She was not complaining of blurry vision at this time.  Will give a headache cocktail as a complex migraine also considered given the transient numbness symptoms.  Anticipate further evaluation when she gets to an exam room but will get labs and CT scans ordered in triage.          Marcey Persad, Marine Sia, MD 08/29/23 [phone]

## 2023-08-30 ENCOUNTER — Emergency Department (HOSPITAL_COMMUNITY): Payer: Self-pay

## 2023-08-30 LAB — CBC WITH DIFFERENTIAL/PLATELET
Abs Immature Granulocytes: 0.02 10*3/uL (ref 0.00–0.07)
Basophils Absolute: 0 10*3/uL (ref 0.0–0.1)
Basophils Relative: 0 %
Eosinophils Absolute: 0.5 10*3/uL (ref 0.0–0.5)
Eosinophils Relative: 7 %
HCT: 36.2 % (ref 36.0–46.0)
Hemoglobin: 11.9 g/dL — ABNORMAL LOW (ref 12.0–15.0)
Immature Granulocytes: 0 %
Lymphocytes Relative: 37 %
Lymphs Abs: 2.7 10*3/uL (ref 0.7–4.0)
MCH: 27 pg (ref 26.0–34.0)
MCHC: 32.9 g/dL (ref 30.0–36.0)
MCV: 82.1 fL (ref 80.0–100.0)
Monocytes Absolute: 0.6 10*3/uL (ref 0.1–1.0)
Monocytes Relative: 9 %
Neutro Abs: 3.2 10*3/uL (ref 1.7–7.7)
Neutrophils Relative %: 47 %
Platelets: 372 10*3/uL (ref 150–400)
RBC: 4.41 MIL/uL (ref 3.87–5.11)
RDW: 15 % (ref 11.5–15.5)
WBC: 7.1 10*3/uL (ref 4.0–10.5)
nRBC: 0 % (ref 0.0–0.2)

## 2023-08-30 LAB — COMPREHENSIVE METABOLIC PANEL WITH GFR
ALT: 29 U/L (ref 0–44)
AST: 34 U/L (ref 15–41)
Albumin: 3.9 g/dL (ref 3.5–5.0)
Alkaline Phosphatase: 115 U/L (ref 38–126)
Anion gap: 12 (ref 5–15)
BUN: 12 mg/dL (ref 6–20)
CO2: 21 mmol/L — ABNORMAL LOW (ref 22–32)
Calcium: 9.5 mg/dL (ref 8.9–10.3)
Chloride: 104 mmol/L (ref 98–111)
Creatinine, Ser: 0.57 mg/dL (ref 0.44–1.00)
GFR, Estimated: >60 mL/min (ref >60)
Glucose, Bld: 95 mg/dL (ref 70–99)
Potassium: 3.7 mmol/L (ref 3.5–5.1)
Sodium: 137 mmol/L (ref 135–145)
Total Bilirubin: 0.6 mg/dL (ref 0.0–1.2)
Total Protein: 8.1 g/dL (ref 6.5–8.1)

## 2023-08-30 MED ORDER — ERYTHROMYCIN 5 MG/GM OP OINT
1.0000 | TOPICAL_OINTMENT | Freq: Once | OPHTHALMIC | Status: AC
  Administered 2023-08-30: 1 via OPHTHALMIC
  Filled 2023-08-30: qty 3.5

## 2023-08-30 MED ORDER — IOHEXOL 350 MG/ML SOLN
75.0000 mL | Freq: Once | INTRAVENOUS | Status: AC | PRN
  Administered 2023-08-30: 75 mL via INTRAVENOUS

## 2023-08-30 MED ORDER — FLUORESCEIN SODIUM 1 MG OP STRP
1.0000 | ORAL_STRIP | Freq: Once | OPHTHALMIC | Status: AC
  Administered 2023-08-30: 1 via OPHTHALMIC

## 2023-08-30 MED ORDER — ERYTHROMYCIN 5 MG/GM OP OINT: TOPICAL_OINTMENT | OPHTHALMIC | 0 refills | Status: AC

## 2023-08-30 MED ORDER — TETRACAINE HCL 0.5 % OP SOLN
1.0000 [drp] | Freq: Once | OPHTHALMIC | Status: AC
  Administered 2023-08-30: 1 [drp] via OPHTHALMIC
  Filled 2023-08-30: qty 4

## 2023-08-30 NOTE — Discharge Instructions (Addendum)
 Gracias por permitirnos evaluarlo hoy. Su tomografa computarizada de crneo no mostr ninguna anomala. Su dolor de cabeza mejor despus del cctel para la migraa. Es probable que esto sea secundario a Designer, industrial/product. Tambin le hemos recetado antibiticos para los ojos, ya que parece que tiene conjuntivitis. Por favor, aplique una tira de 1,27 cm de ungento en el prpado inferior cada 6 horas durante 7 das.  Lvese las manos regularmente y evite frotarse los ojos o tocar los ojos de otras personas, ya que es contagioso.  Regrese a urgencias si experimenta entumecimiento u hormigueo en un lado del cuerpo, incapacidad para caminar, dificultad para hablar, alteracin mental, prdida de visin o visin borrosa significativa.

## 2023-08-30 NOTE — ED Provider Notes (Signed)
 [place] EMERGENCY DEPARTMENT AT [facility] Provider Note   CSN: [id number] Arrival date & time: 08/29/23  2219     History  Chief Complaint  Patient presents with   Headache   Numbness    Danielle Campbell is a 48 y.o. female with past medical history of cholelithiasis, appendectomy, total hysterectomy presents emergency department for evaluation of right-sided headache, paresthesia, and dry mouth that started at 2030 tonight while watching tv.  Headache noted to be 5/10 at that time.  Symptoms lasted for 10 to 15 minutes. No history of migraines nor recent falls. Denies any difficulty with word finding, speaking, ambulating, weakness, paresthesia to extremities, nor fever.  Also complains of burning sensation and discharge in left eye that started at 2100.  Currently following migraine cocktail, has no complaints of paresthesia, headache. The history is provided by the patient. The history is limited by a language barrier. A language interpreter was used.  Headache      Home Medications Prior to Admission medications   Medication Sig Start Date End Date Taking? Authorizing Provider  erythromycin ophthalmic ointment Place a 1/2 inch ribbon of ointment into the lower eyelid every six hours for seven days. Coloque una tira de unguento de 1/2 pulgada en el parpado inferior cada seis horas durante siete dias. 08/30/23  Yes Royann Cords, PA  acetaminophen  (TYLENOL ) 500 MG tablet Take 2 tablets (1,000 mg total) by mouth every 6 (six) hours as needed. 08/02/23   Charlott Converse, PA-C  famotidine  (PEPCID ) 20 MG tablet Take 1 tablet (20 mg total) by mouth 2 (two) times daily. Patient taking differently: Take 20 mg by mouth daily. 10/06/22   Merdis Stalling, MD  ibuprofen  (ADVIL ) 200 MG tablet Take 3 tablets (600 mg total) by mouth every 8 (eight) hours as needed. 08/02/23   Simaan, Claudis Cumber, PA-C  MACA ROOT PO Take 1 capsule by mouth daily.    [provider]  Magnesium Citrate 100 MG TABS Take by mouth every evening.    [provider]  Multiple Vitamins-Minerals (WOMENS MULTIVITAMIN PO) Take by mouth.    [provider]  oxyCODONE  (OXY IR/ROXICODONE ) 5 MG immediate release tablet Take 1 tablet (5 mg total) by mouth every 6 (six) hours as needed for severe pain (pain score 7-10) (not releived by tylenol  and advil ). 08/02/23   Charlott Converse, PA-C  Potassium 95 MG TABS Take 95 mg by mouth daily.    [provider]  VALERIAN ROOT PO Take by mouth every evening.    [provider]      Allergies    Hydrocodone  and Morphine     Review of Systems   Review of Systems  Neurological:  Positive for headaches.    Physical Exam Updated Vital Signs BP 119/67 (BP Location: Right Arm)   Pulse 84   Temp 99.1 F (37.3 C) (Oral)   Resp 16   Ht 5\' 3"  (1.6 m)   Wt 87.5 kg   LMP 09/12/2021 (Approximate)   SpO2 100%   BMI 34.19 kg/m  Physical Exam Vitals and nursing note reviewed.  Constitutional:      General: She is not in acute distress.    Appearance: Normal appearance.  HENT:     Head: Normocephalic and atraumatic.  Eyes:     General: Lids are normal. Vision grossly intact. No visual field deficit.       Left eye: Discharge present.    Extraocular  Movements: Extraocular movements intact.     Right eye: Normal extraocular motion and no nystagmus.     Left eye: Normal extraocular motion and no nystagmus.     Conjunctiva/sclera:     Left eye: Left conjunctiva is injected. No chemosis, exudate or hemorrhage. Cardiovascular:     Rate and Rhythm: Normal rate.  Pulmonary:     Effort: Pulmonary effort is normal. No respiratory distress.  Musculoskeletal:     Cervical back: Normal range of motion and neck supple. No rigidity.  Skin:    Coloration: Skin is not jaundiced or pale.  Neurological:     Mental Status: She is alert and oriented to person, place, and time. Mental status is at  baseline.     GCS: GCS eye subscore is 4. GCS verbal subscore is 5. GCS motor subscore is 6.     Cranial Nerves: No cranial nerve deficit, dysarthria or facial asymmetry.     Sensory: Sensation is intact. No sensory deficit.     Motor: No weakness, tremor, atrophy, abnormal muscle tone, seizure activity or pronator drift.     Coordination: Coordination normal. Finger-Nose-Finger Test and Heel to [place] Test normal.     Gait: Gait is intact.     Comments: Follows commands appropriately.  Able to raise both eyebrows equally     ED Results / Procedures / Treatments   Labs (all labs ordered are listed, but only abnormal results are displayed) Labs Reviewed  CBC WITH DIFFERENTIAL/PLATELET - Abnormal; Notable for the following components:      Result Value   Hemoglobin 11.9 (*)    All other components within normal limits  COMPREHENSIVE METABOLIC PANEL WITH GFR - Abnormal; Notable for the following components:   CO2 21 (*)    All other components within normal limits    EKG None  Radiology CT ANGIO HEAD NECK W WO CM Result Date: 08/30/2023 CLINICAL DATA:  Head and neck pain with right facial numbness and tingling EXAM: CT ANGIOGRAPHY HEAD AND NECK WITH AND WITHOUT CONTRAST TECHNIQUE: Multidetector CT imaging of the head and neck was performed using the standard protocol during bolus administration of intravenous contrast. Multiplanar CT image reconstructions and MIPs were obtained to evaluate the vascular anatomy. Carotid stenosis measurements (when applicable) are obtained utilizing NASCET criteria, using the distal internal carotid diameter as the denominator. RADIATION DOSE REDUCTION: This exam was performed according to the departmental dose-optimization program which includes automated exposure control, adjustment of the mA and/or kV according to patient size and/or use of iterative reconstruction technique. CONTRAST:  75mL OMNIPAQUE  IOHEXOL  350 MG/ML SOLN COMPARISON:  None Available.  FINDINGS: CT HEAD FINDINGS Brain: No mass,hemorrhage or extra-axial collection. Normal appearance of the parenchyma and CSF spaces. Vascular: No hyperdense vessel or unexpected vascular calcification. Skull: The visualized skull base, calvarium and extracranial soft tissues are normal. Sinuses/Orbits: No fluid levels or advanced mucosal thickening of the visualized paranasal sinuses. No mastoid or middle ear effusion. Normal orbits. CTA NECK FINDINGS Skeleton: No acute abnormality or high grade bony spinal canal stenosis. Other neck: Normal pharynx, larynx and major salivary glands. No cervical lymphadenopathy. Unremarkable thyroid gland. Upper chest: No pneumothorax or pleural effusion. No nodules or masses. Aortic arch: There is no calcific atherosclerosis of the aortic arch. Conventional 3 vessel aortic branching pattern. RIGHT carotid system: Normal without aneurysm, dissection or stenosis. LEFT carotid system: Normal without aneurysm, dissection or stenosis. Vertebral arteries: Codominant configuration. There is no dissection, occlusion or flow-limiting stenosis to the skull base (  V1-V3 segments). CTA HEAD FINDINGS POSTERIOR CIRCULATION: Vertebral arteries are normal. No proximal occlusion of the anterior or inferior cerebellar arteries. Basilar artery is normal. Superior cerebellar arteries are normal. Posterior cerebral arteries are normal. ANTERIOR CIRCULATION: Intracranial internal carotid arteries are normal. Anterior cerebral arteries are normal. Middle cerebral arteries are normal. Venous sinuses: As permitted by contrast timing, patent. Anatomic variants: None Review of the MIP images confirms the above findings. IMPRESSION: Normal CTA of the head and neck. Electronically Signed   By: Juanetta Nordmann M.D.   On: 08/30/2023 01:31    Procedures Procedures    Medications Ordered in ED Medications  tetracaine  (PONTOCAINE) 0.5 % ophthalmic solution 1 drop (has no administration in time range)   erythromycin  ophthalmic ointment 1 Application (has no administration in time range)  prochlorperazine  (COMPAZINE ) injection 10 mg (10 mg Intravenous Given 08/29/23 2320)  diphenhydrAMINE  (BENADRYL ) injection 50 mg (50 mg Intravenous Given 08/29/23 2320)  fluorescein  ophthalmic strip 1 strip (1 strip Right Eye Given 08/30/23 0047)  iohexol  (OMNIPAQUE ) 350 MG/ML injection 75 mL (75 mLs Intravenous Contrast Given 08/30/23 0056)    ED Course/ Medical Decision Making/ A&P                                 Medical Decision Making Amount and/or Complexity of Data Reviewed Labs: ordered.  Risk Prescription drug management.   Patient presents to the ED for concern of HA, paresthesia, discharge and burning from eyes, this involves an extensive number of treatment options, and is a complaint that carries with it a high risk of complications and morbidity.  The differential diagnosis includes migraine, CVA/TIA, trigeminal neuralgia, shingles, Ramsay Hunt, conjunctivitis, MS, foreign body, corneal abrasion.  Not an exhaustive list   Co morbidities that complicate the patient evaluation  See HPI   Additional history obtained:  Additional history obtained from [facility]   External records from outside source obtained and reviewed including triage RN note, triage provider note   Lab Tests:  I Ordered, and personally interpreted labs.  The pertinent results include:   Hgb of 11.9   Imaging Studies ordered:  I ordered imaging studies including CTA head neck I independently visualized and interpreted imaging which showed no acute intracranial nor neck abnormality I agree with the radiologist interpretation     Medicines ordered and prescription drug management:  I ordered medication including Compazine  and Benadryl  for headache Reevaluation of the patient after these medicines showed that the patient resolved I have reviewed the patients home medicines and have made  adjustments as needed     Problem List / ED Course:  Conjunctivitis Does not wear contact lens Has discharge and injection biocularly No pain with EOM but reports some mild burning. No visual disturbances such as blurred vision, loss of vision  No corneal abrasion nor foreign body noted Will provide Erythromycin  Discussed importance of washing hands and not rubbing both eyes as it can spread to herself or others  Rash Per triage provider note, reports small erythematous rash to right forehead On my exam, there is no rash to face, ears, eyes, nor nose which is reassuring Low suspicion for Ramsay Hunt, Hutchinson sign, herpes zoster ophthalmicus with no rash in ears, nose.  No dendritic lesions on fluorescein  stain.  May have been due to rubbing face/irritation/etc  Paresthesia HA No history of migraines Symptoms started at 2130 tonight and lasted 15 minutes.  Had no other  complaints to include visual disturbances, weakness, aphasia, difficulty ambulating.  Family at bedside who are with her during onset of symptoms also deny AMS, difficulty with speech, difficulty ambulating. It is noted on triage provider note that patient noted some paresthesia that radiated into right upper extremity.  However, she adamantly denies this with me.  Reports complete resolution of headache, paresthesia following migraine cocktail Neuro intact.  No paresthesia, numbness, weakness to face, upper nor lower extremities.  Able to ambulate without difficulty. I am reassured that all symptoms resolved with migraine cocktail making me think that they are likely related to a complex migraine CTA head neck without acute abnormality Strict return precautions for signs of CVA/TIA provided to patient in Spanish on dispo and on discharge instructions   Reevaluation:  After the interventions noted above, I reevaluated the patient and found that they have :improved    Dispostion:  After consideration of the  diagnostic results and the patients response to treatment, I feel that the patent would benefit from outpatient management with abx for conjunctivitis.   Discussed ED workup, disposition, return to ED precautions with patient who expresses understanding agrees with plan.  All questions answered to their satisfaction.  They are agreeable to plan.  Discharge instructions provided on paperwork  Dr. Jeannie Milo individually assessed patient, reviewed ED workup and agrees with plan Final Clinical Impression(s) / ED Diagnoses Final diagnoses:  Acute bacterial conjunctivitis of both eyes  Bad headache    Rx / DC Orders ED Discharge Orders          Ordered    erythromycin ophthalmic ointment        08/30/23 0152              Royann Cords, PA 08/30/23 1610    Karlyn Overman, MD 08/30/23 2231

## 2023-08-30 NOTE — ED Provider Notes (Incomplete)
 [place] EMERGENCY DEPARTMENT AT [facility] Provider Note   CSN: [id number] Arrival date & time: 08/29/23  2219     History {Add pertinent medical, surgical, social history, OB history to HPI:1} Chief Complaint  Patient presents with  . Headache  . Numbness    Danielle Campbell is a 48 y.o. female.   Headache      Home Medications Prior to Admission medications   Medication Sig Start Date End Date Taking? Authorizing Provider  acetaminophen  (TYLENOL ) 500 MG tablet Take 2 tablets (1,000 mg total) by mouth every 6 (six) hours as needed. 08/02/23   Charlott Converse, PA-C  famotidine  (PEPCID ) 20 MG tablet Take 1 tablet (20 mg total) by mouth 2 (two) times daily. Patient taking differently: Take 20 mg by mouth daily. 10/06/22   Merdis Stalling, MD  ibuprofen  (ADVIL ) 200 MG tablet Take 3 tablets (600 mg total) by mouth every 8 (eight) hours as needed. 08/02/23   Simaan, Claudis Cumber, PA-C  MACA ROOT PO Take 1 capsule by mouth daily.    [provider]  Magnesium Citrate 100 MG TABS Take by mouth every evening.    [provider]  Multiple Vitamins-Minerals (WOMENS MULTIVITAMIN PO) Take by mouth.    [provider]  oxyCODONE  (OXY IR/ROXICODONE ) 5 MG immediate release tablet Take 1 tablet (5 mg total) by mouth every 6 (six) hours as needed for severe pain (pain score 7-10) (not releived by tylenol  and advil ). 08/02/23   Charlott Converse, PA-C  Potassium 95 MG TABS Take 95 mg by mouth daily.    [provider]  VALERIAN ROOT PO Take by mouth every evening.    [provider]      Allergies    Hydrocodone  and Morphine     Review of Systems   Review of Systems  Neurological:  Positive for headaches.    Physical Exam Updated Vital Signs BP (!) 159/90 (BP Location: Right Arm)   Pulse 72   Temp 98.1 F (36.7 C)   Resp 20   Ht 5\' 3"  (1.6 m)   Wt 87.5 kg   LMP 09/12/2021 (Approximate)   SpO2 100%   BMI  34.19 kg/m  Physical Exam  ED Results / Procedures / Treatments   Labs (all labs ordered are listed, but only abnormal results are displayed) Labs Reviewed  CBC WITH DIFFERENTIAL/PLATELET  COMPREHENSIVE METABOLIC PANEL WITH GFR    EKG None  Radiology No results found.  Procedures Procedures  {Document cardiac monitor, telemetry assessment procedure when appropriate:1}  Medications Ordered in ED Medications  prochlorperazine  (COMPAZINE ) injection 10 mg (10 mg Intravenous Given 08/29/23 2320)  diphenhydrAMINE  (BENADRYL ) injection 50 mg (50 mg Intravenous Given 08/29/23 2320)    ED Course/ Medical Decision Making/ A&P   {   Click here for ABCD2, HEART and other calculatorsREFRESH Note before signing :1}                              Medical Decision Making  ***  {Document critical care time when appropriate:1} {Document review of labs and clinical decision tools ie heart score, Chads2Vasc2 etc:1}  {Document your independent review of radiology images, and any outside records:1} {Document your discussion with family members, caretakers, and with consultants:1} {Document social determinants of health affecting pt's care:1} {Document your decision making why or why not admission, treatments were needed:1} Final Clinical Impression(s) / ED Diagnoses Final diagnoses:  None    Rx / DC Orders ED Discharge Orders     None
